# Patient Record
Sex: Female | Born: 1955
Health system: Southern US, Community
[De-identification: ages and names within clinical notes are randomized; demographics above are authoritative.]

## PROBLEM LIST (undated history)

## (undated) DIAGNOSIS — E785 Hyperlipidemia, unspecified: Secondary | ICD-10-CM

## (undated) DIAGNOSIS — I10 Essential (primary) hypertension: Secondary | ICD-10-CM

## (undated) DIAGNOSIS — M810 Age-related osteoporosis without current pathological fracture: Secondary | ICD-10-CM

## (undated) DIAGNOSIS — K219 Gastro-esophageal reflux disease without esophagitis: Secondary | ICD-10-CM

## (undated) DIAGNOSIS — I499 Cardiac arrhythmia, unspecified: Secondary | ICD-10-CM

## (undated) HISTORY — PX: ABDOMINAL HYSTERECTOMY: SHX81

## (undated) HISTORY — PX: APPENDECTOMY: SHX54

## (undated) HISTORY — DX: Essential (primary) hypertension: I10

## (undated) HISTORY — DX: Hyperlipidemia, unspecified: E78.5

## (undated) HISTORY — DX: Cardiac arrhythmia, unspecified: I49.9

## (undated) NOTE — Progress Notes (Signed)
Formatting of this note is different from the original.  .Patient ID:  Sherry Henderson is a 70 y.o. (DOB Jan 19, 1956) female.     CHIEF COMPLAINT       Patient presents with   ? Dysuria     x 5-6 days, pt states her infection are recurring       BP 102/82 (BP Location: Right arm, Patient Position: Sitting)  Pulse 72  Resp 18  Wt 163 lb 4.8 oz (74.1 kg)  BMI 24.12 kg/m2    PAST MEDICAL HISTORY     Past Medical History:   Diagnosis Date   ? Depression    ? Hyperlipidemia    ? Hypertension      PAST SURGICAL HISTORY   History reviewed. No pertinent surgical history.    FAMILY HISTORY     Family History   Problem Relation Age of Onset   ? Hypertension Mother    ? Diabetes Father      SOCIAL HISTORY    reports that she has never smoked. She has never used smokeless tobacco.    MEDICATIONS AS OF CURRENT ENCOUNTER     Outpatient Encounter Prescriptions as of 07/22/2016   Medication Sig Dispense Refill   ? alendronate (FOSAMAX) 70 mg tablet take 1 tablet BY MOUTH EVERY WEEK ON AN EMPTY STOMACH WITH 6 TO 8 OUNCES OF WATER; AVOID FOOD/MEDICATIONS FOR 30 MINUTES; REMAIN UPRIGHT 4 tablet 5   ? atorvastatin (LIPITOR) 40 mg tablet Take one tablet (40 mg total) by mouth daily. 30 tablet 11   ? dorzolamide (TRUSOPT) 2% opthalmic solution INSTILL 1 DROP INTO BOTH EYES TWICE DAILY  1   ? dorzolamide-timolol (COSOPT) 22.3-6.8 MG/ML ophthalmic solution instill 1 drop into both eyes twice a day  0   ? latanoprost (XALATAN) 0.005% ophthalmic solution Place 1 drop into both eyes daily.   1   ? losartan potassium (COZAAR) 50 mg tablet take 1 tablet by mouth once daily 30 tablet 2   ? Misc. Devices MISC Patient needs to have a pelvic trans vaginal ultrasound.  Dx right lower quadrant pain 1 each 0   ? [DISCONTINUED] diclofenac sodium (VOLTAREN) 75 mg EC tablet Take one tablet (75 mg total) by mouth 2 (two) times daily. 20 tablet 0   ? [DISCONTINUED] timolol maleate (TIMOPTIC) 0.5% ophthalmic solution INSTILL 1 DROP INTO BOTH EYES  TWICE DAILY  1     No facility-administered encounter medications on file as of 07/22/2016.      ALLERGIES   Review of patient's allergies indicates no known allergies.    BP 102/82 (BP Location: Right arm, Patient Position: Sitting)  Pulse 72  Resp 18  Wt 163 lb 4.8 oz (74.1 kg)  BMI 24.12 kg/m2  HISTORY OF PRESENT ILLNESS     Pt of D.r Bolling  Hx of HTN doing fine with meds  No allergies  Burning on urination for about 5 days  Tried hydration but symptoms persistent  No n/v no back pain no fever no chills  No hematuria    PHYSICAL EXAM   BP 102/82 (BP Location: Right arm, Patient Position: Sitting)  Pulse 72  Resp 18  Wt 163 lb 4.8 oz (74.1 kg)  BMI 24.12 kg/m2   Vital signs reviewed  General:  Well-developed, well-nourished female in no acute distress  NECK: Normal appearance  Neck: Soft, supple, no adenopathy, normal appearance  Heart:  Regular rate and rhythm without murmurs, rubs or gallops.  Lungs: Clear to ausculation  bilaterally.  Abdomen: Soft, non-tender, nondistended, without hepatosplenomegaly or masses.  Normal bowel sounds.    Musculoskeletal exam: No cyanosis of fingers, No swelling of legs  Nails:  No clubbing of fingernails    No cva tenderness    ASSESSMENT AND PLAN     Urinary tract infection  Urine appears infected  Will send culture  Will try bactrim DS 1 twice a day for 3 days    Patient instructed to return to the office or the emergency department if symptoms worsen or persist.    HTN  Continue meds  At goal under 140/90    Electronically signed by Roberts Gaudy, MD at 07/22/2016  4:31 PM EDT

---

## 2013-04-16 ENCOUNTER — Emergency Department (HOSPITAL_COMMUNITY)
Admission: EM | Admit: 2013-04-16 | Discharge: 2013-04-16 | Disposition: A | Discharge status: Home / Self Care | Payer: Self-pay | Attending: Emergency Medicine | Admitting: Emergency Medicine

## 2013-04-16 ENCOUNTER — Emergency Department (HOSPITAL_COMMUNITY): Payer: Self-pay

## 2013-04-16 ENCOUNTER — Encounter (HOSPITAL_COMMUNITY): Payer: Self-pay | Admitting: Emergency Medicine

## 2013-04-16 DIAGNOSIS — K219 Gastro-esophageal reflux disease without esophagitis: Secondary | ICD-10-CM | POA: Insufficient documentation

## 2013-04-16 DIAGNOSIS — Z79899 Other long term (current) drug therapy: Secondary | ICD-10-CM | POA: Insufficient documentation

## 2013-04-16 DIAGNOSIS — Y9229 Other specified public building as the place of occurrence of the external cause: Secondary | ICD-10-CM | POA: Insufficient documentation

## 2013-04-16 DIAGNOSIS — I951 Orthostatic hypotension: Secondary | ICD-10-CM | POA: Insufficient documentation

## 2013-04-16 DIAGNOSIS — I10 Essential (primary) hypertension: Secondary | ICD-10-CM | POA: Insufficient documentation

## 2013-04-16 DIAGNOSIS — S0003XA Contusion of scalp, initial encounter: Secondary | ICD-10-CM | POA: Insufficient documentation

## 2013-04-16 DIAGNOSIS — W1809XA Striking against other object with subsequent fall, initial encounter: Secondary | ICD-10-CM | POA: Insufficient documentation

## 2013-04-16 DIAGNOSIS — E785 Hyperlipidemia, unspecified: Secondary | ICD-10-CM | POA: Insufficient documentation

## 2013-04-16 DIAGNOSIS — R42 Dizziness and giddiness: Secondary | ICD-10-CM | POA: Insufficient documentation

## 2013-04-16 DIAGNOSIS — S0993XA Unspecified injury of face, initial encounter: Secondary | ICD-10-CM | POA: Insufficient documentation

## 2013-04-16 DIAGNOSIS — Y9301 Activity, walking, marching and hiking: Secondary | ICD-10-CM | POA: Insufficient documentation

## 2013-04-16 DIAGNOSIS — R55 Syncope and collapse: Secondary | ICD-10-CM | POA: Insufficient documentation

## 2013-04-16 DIAGNOSIS — M810 Age-related osteoporosis without current pathological fracture: Secondary | ICD-10-CM | POA: Insufficient documentation

## 2013-04-16 HISTORY — DX: Age-related osteoporosis without current pathological fracture: M81.0

## 2013-04-16 HISTORY — DX: Gastro-esophageal reflux disease without esophagitis: K21.9

## 2013-04-16 HISTORY — DX: Essential (primary) hypertension: I10

## 2013-04-16 HISTORY — DX: Hyperlipidemia, unspecified: E78.5

## 2013-04-16 LAB — BASIC METABOLIC PANEL
CO2: 25 mEq/L (ref 19–32)
Chloride: 104 mEq/L (ref 96–112)
Creatinine, Ser: 0.93 mg/dL (ref 0.50–1.10)
GFR calc Af Amer: 76 mL/min — ABNORMAL LOW (ref >90)
Potassium: 3.6 mEq/L (ref 3.5–5.1)
Sodium: 141 mEq/L (ref 135–145)

## 2013-04-16 LAB — CBC
MCV: 95.6 fL (ref 78.0–100.0)
Platelets: 296 10*3/uL (ref 150–400)
RBC: 4.12 MIL/uL (ref 3.87–5.11)
RDW: 12.7 % (ref 11.5–15.5)
WBC: 7 10*3/uL (ref 4.0–10.5)

## 2013-04-16 MED ORDER — SODIUM CHLORIDE 0.9 % IV BOLUS (SEPSIS)
1000.0000 mL | Freq: Once | INTRAVENOUS | Status: AC
  Administered 2013-04-16: 1000 mL via INTRAVENOUS

## 2013-04-16 NOTE — ED Notes (Signed)
ZOX:[id number]<AV> Expected date:04/16/13<BR> Expected time: 7:37 PM<BR> Means of arrival:Ambulance<BR> Comments:<BR> Syncopal episode

## 2013-04-16 NOTE — ED Provider Notes (Addendum)
History    CSN: [id number] Arrival date & time 04/16/13  1956  First MD Initiated Contact with Patient 04/16/13 2020     Chief Complaint  Patient presents with  . Loss of Consciousness   (Consider location/radiation/quality/duration/timing/severity/associated sxs/prior Treatment) HPI Comments: Pt comes in with cc of syncope. Pt has no cardiac hx. States that she donated blood, and got up - only to get dizzy, and then pass out. Pt had no chest pain, palpitations, dib. No hx of CHF, premature CAD in the family. Pt has a bruise to the head, and some neck stiffness from the fall.  No hx of PE, DVT, pt is on hormone therapy.  The history is provided by the patient.   Past Medical History  Diagnosis Date  . Hypertension   . Hyperlipidemia   . Osteoporosis   . GERD (gastroesophageal reflux disease)    Past Surgical History  Procedure Laterality Date  . Abdominal hysterectomy      at 43  . Appendectomy      at 57yo   History reviewed. No pertinent family history. History  Substance Use Topics  . Smoking status: Never Smoker   . Smokeless tobacco: Never Used  . Alcohol Use: 0.6 oz/week    1 Cans of beer per week   OB History   Grav Para Term Preterm Abortions TAB SAB Ect Mult Living                 Review of Systems  Constitutional: Negative for activity change.  HENT: Positive for neck stiffness. Negative for neck pain.   Respiratory: Negative for shortness of breath.   Cardiovascular: Negative for chest pain and palpitations.  Gastrointestinal: Negative for nausea, vomiting and abdominal pain.  Genitourinary: Negative for dysuria.  Neurological: Positive for syncope.  Hematological: Does not bruise/bleed easily.    Allergies  Review of patient's allergies indicates no known allergies.  Home Medications   Current Outpatient Rx  Name  Route  Sig  Dispense  Refill  . alendronate (FOSAMAX) 70 MG tablet   Oral   Take 70 mg by mouth every 7 (seven) days. Take  with a full glass of water on an empty stomach.  Taken on Sundays.         Marland Kitchen atenolol (TENORMIN) 50 MG tablet   Oral   Take 50 mg by mouth daily.         . cetirizine (ZYRTEC) 10 MG tablet   Oral   Take 10 mg by mouth daily as needed for allergies.         Marland Kitchen estradiol (ESTRACE) 0.5 MG tablet   Oral   Take 0.5 mg by mouth daily.         Marland Kitchen latanoprost (XALATAN) 0.005 % ophthalmic solution   Both Eyes   Place 1 drop into both eyes daily.         . Multiple Vitamin (MULTIVITAMIN WITH MINERALS) TABS   Oral   Take 1 tablet by mouth daily.         Marland Kitchen omeprazole (PRILOSEC) 40 MG capsule   Oral   Take 40 mg by mouth daily as needed (for heartburn).          . simvastatin (ZOCOR) 20 MG tablet   Oral   Take 20 mg by mouth daily.           BP 110/53  Pulse 57  Temp(Src) 98.1 F (36.7 C) (Oral)  Resp 16  Ht 5\' 10"  (  1.778 m)  Wt 180 lb (81.647 kg)  BMI 25.83 kg/m2  SpO2 99% Physical Exam  Nursing note and vitals reviewed. Constitutional: She is oriented to person, place, and time. She appears well-developed and well-nourished.  HENT:  Head: Normocephalic and atraumatic.  Eyes: EOM are normal. Pupils are equal, round, and reactive to light.  Neck: Neck supple. No JVD present.  No midline c-spine tenderness, pt able to turn head to 45 degrees bilaterally without any pain and able to flex neck to the chest and extend without any pain or neurologic symptoms.   Cardiovascular: Normal rate, regular rhythm and normal heart sounds.   No murmur heard. Pulmonary/Chest: Effort normal. No respiratory distress.  Abdominal: Soft. She exhibits no distension. There is no tenderness. There is no rebound and no guarding.  Musculoskeletal:  Hematoma to the right scalp  Head to toe evaluation shows no bleeding of the scalp, no facial abrasions, step offs, crepitus, no tenderness to palpation of the bilateral upper and lower extremities, no gross deformities, no chest tenderness,  no pelvic pain.   Neurological: She is alert and oriented to person, place, and time.  Skin: Skin is warm and dry.    ED Course  Procedures (including critical care time) Labs Reviewed  CBC  BASIC METABOLIC PANEL   No results found. No diagnosis found.  MDM  DDx includes: Orthostatic hypotension Stroke Vertebral artery dissection/stenosis Dysrhythmia PE Vasovagal/neurocardiogenic syncope Aortic stenosis Valvular disorder/Cardiomyopathy Anemia   Pt comes in with cc of syncope.   Date: 04/16/2013  Rate: 50  Rhythm: normal sinus rhythm and premature ventricular contractions (PVC)  QRS Axis: normal  Intervals: normal  ST/T Wave abnormalities: normal  Conduction Disutrbances:none  Narrative Interpretation:   Old EKG Reviewed: none available  Pt just donated blood and had a syncopal episode. She has no cardiac hx, no CHF hx. She is noted to be bradycardic, but her HR was 55 at my evaluation, and she is completely asymptomatic, she is not on any beta blockers - i suspect that the bradycardia is her baseline normal HR. No AV or interval abnormalities appreciated. PE also not likely given the presentation being right after the blood was donated. Cspine cleared clinically. CT head ordered as per the bruise and a headache.     Derwood Kaplan, MD 04/16/13 2113  Derwood Kaplan, MD 04/16/13 2148

## 2013-04-16 NOTE — ED Notes (Signed)
Pt stated she donated blood today, has not donated since a reaction x1 year ago after donating plasma.  Pt used to donate blood regularly before that.  Pt reports inadequate fluid intake today.  Pt walked [place] the canteen then fell hitting the R side of her head and chipping her R upper tooth.  Pt reports neck pain as well.  Pt placed in C-collar.  Pt a&o x4, states she does not remember falling.

## 2013-04-16 NOTE — ED Notes (Signed)
Unable to draw blood off of IV stick, phlebotomy at bedside to draw at this time

## 2016-03-02 DIAGNOSIS — K5730 Diverticulosis of large intestine without perforation or abscess without bleeding: Secondary | ICD-10-CM | POA: Diagnosis not present

## 2016-03-02 DIAGNOSIS — Z1211 Encounter for screening for malignant neoplasm of colon: Secondary | ICD-10-CM | POA: Diagnosis not present

## 2016-03-02 DIAGNOSIS — K648 Other hemorrhoids: Secondary | ICD-10-CM | POA: Diagnosis not present

## 2016-06-01 DIAGNOSIS — H401133 Primary open-angle glaucoma, bilateral, severe stage: Secondary | ICD-10-CM | POA: Diagnosis not present

## 2016-06-01 DIAGNOSIS — H2513 Age-related nuclear cataract, bilateral: Secondary | ICD-10-CM | POA: Diagnosis not present

## 2016-07-22 DIAGNOSIS — Z23 Encounter for immunization: Secondary | ICD-10-CM | POA: Diagnosis not present

## 2016-07-22 DIAGNOSIS — I10 Essential (primary) hypertension: Secondary | ICD-10-CM | POA: Diagnosis not present

## 2016-07-22 DIAGNOSIS — R399 Unspecified symptoms and signs involving the genitourinary system: Secondary | ICD-10-CM | POA: Diagnosis not present

## 2016-12-03 DIAGNOSIS — H401133 Primary open-angle glaucoma, bilateral, severe stage: Secondary | ICD-10-CM | POA: Diagnosis not present

## 2016-12-03 DIAGNOSIS — H00025 Hordeolum internum left lower eyelid: Secondary | ICD-10-CM | POA: Diagnosis not present

## 2016-12-16 DIAGNOSIS — I10 Essential (primary) hypertension: Secondary | ICD-10-CM | POA: Diagnosis not present

## 2016-12-16 DIAGNOSIS — Z23 Encounter for immunization: Secondary | ICD-10-CM | POA: Diagnosis not present

## 2016-12-16 DIAGNOSIS — E7800 Pure hypercholesterolemia, unspecified: Secondary | ICD-10-CM | POA: Diagnosis not present

## 2016-12-16 DIAGNOSIS — E7439 Other disorders of intestinal carbohydrate absorption: Secondary | ICD-10-CM | POA: Diagnosis not present

## 2016-12-16 DIAGNOSIS — M810 Age-related osteoporosis without current pathological fracture: Secondary | ICD-10-CM | POA: Diagnosis not present

## 2016-12-24 ENCOUNTER — Other Ambulatory Visit: Payer: Self-pay | Admitting: Physician Assistant

## 2016-12-24 DIAGNOSIS — Z1231 Encounter for screening mammogram for malignant neoplasm of breast: Secondary | ICD-10-CM

## 2017-01-14 DIAGNOSIS — R109 Unspecified abdominal pain: Secondary | ICD-10-CM | POA: Diagnosis not present

## 2017-01-17 ENCOUNTER — Other Ambulatory Visit: Payer: Self-pay | Admitting: Physician Assistant

## 2017-01-17 DIAGNOSIS — R109 Unspecified abdominal pain: Secondary | ICD-10-CM

## 2017-01-18 ENCOUNTER — Other Ambulatory Visit: Payer: Self-pay

## 2017-01-21 ENCOUNTER — Ambulatory Visit
Admission: RE | Admit: 2017-01-21 | Discharge: 2017-01-21 | Disposition: A | Discharge status: Home / Self Care | Payer: BLUE CROSS/BLUE SHIELD | Source: Ambulatory Visit | Attending: Physician Assistant | Admitting: Physician Assistant

## 2017-01-21 DIAGNOSIS — R109 Unspecified abdominal pain: Secondary | ICD-10-CM | POA: Diagnosis not present

## 2017-01-21 DIAGNOSIS — Z1231 Encounter for screening mammogram for malignant neoplasm of breast: Secondary | ICD-10-CM | POA: Diagnosis not present

## 2017-01-21 MED ORDER — IOPAMIDOL (ISOVUE-300) INJECTION 61%
100.0000 mL | Freq: Once | INTRAVENOUS | Status: AC | PRN
  Administered 2017-01-21: 100 mL via INTRAVENOUS

## 2017-06-15 DIAGNOSIS — H401133 Primary open-angle glaucoma, bilateral, severe stage: Secondary | ICD-10-CM | POA: Diagnosis not present

## 2017-06-15 DIAGNOSIS — H2513 Age-related nuclear cataract, bilateral: Secondary | ICD-10-CM | POA: Diagnosis not present

## 2017-06-22 ENCOUNTER — Other Ambulatory Visit (HOSPITAL_COMMUNITY)
Admission: RE | Admit: 2017-06-22 | Discharge: 2017-06-22 | Disposition: A | Discharge status: Home / Self Care | Payer: BLUE CROSS/BLUE SHIELD | Source: Ambulatory Visit | Attending: Family Medicine | Admitting: Family Medicine

## 2017-06-22 DIAGNOSIS — Z01419 Encounter for gynecological examination (general) (routine) without abnormal findings: Secondary | ICD-10-CM | POA: Diagnosis not present

## 2017-06-23 ENCOUNTER — Other Ambulatory Visit: Payer: Self-pay | Admitting: Physician Assistant

## 2017-06-23 DIAGNOSIS — M810 Age-related osteoporosis without current pathological fracture: Secondary | ICD-10-CM | POA: Diagnosis not present

## 2017-06-23 DIAGNOSIS — E7800 Pure hypercholesterolemia, unspecified: Secondary | ICD-10-CM | POA: Diagnosis not present

## 2017-06-23 DIAGNOSIS — Z0000 Encounter for general adult medical examination without abnormal findings: Secondary | ICD-10-CM | POA: Diagnosis not present

## 2017-06-23 DIAGNOSIS — Z124 Encounter for screening for malignant neoplasm of cervix: Secondary | ICD-10-CM | POA: Diagnosis not present

## 2017-06-23 DIAGNOSIS — I10 Essential (primary) hypertension: Secondary | ICD-10-CM | POA: Diagnosis not present

## 2017-06-24 LAB — CYTOLOGY - PAP: DIAGNOSIS: NEGATIVE

## 2017-08-18 DIAGNOSIS — M810 Age-related osteoporosis without current pathological fracture: Secondary | ICD-10-CM | POA: Diagnosis not present

## 2017-11-22 DIAGNOSIS — H2513 Age-related nuclear cataract, bilateral: Secondary | ICD-10-CM | POA: Diagnosis not present

## 2017-11-22 DIAGNOSIS — H401133 Primary open-angle glaucoma, bilateral, severe stage: Secondary | ICD-10-CM | POA: Diagnosis not present

## 2017-12-01 ENCOUNTER — Other Ambulatory Visit: Payer: Self-pay | Admitting: Physician Assistant

## 2017-12-01 ENCOUNTER — Ambulatory Visit
Admission: RE | Admit: 2017-12-01 | Discharge: 2017-12-01 | Disposition: A | Discharge status: Home / Self Care | Payer: BLUE CROSS/BLUE SHIELD | Source: Ambulatory Visit | Attending: Physician Assistant | Admitting: Physician Assistant

## 2017-12-01 DIAGNOSIS — R079 Chest pain, unspecified: Secondary | ICD-10-CM

## 2017-12-05 DIAGNOSIS — H2513 Age-related nuclear cataract, bilateral: Secondary | ICD-10-CM | POA: Diagnosis not present

## 2017-12-05 DIAGNOSIS — H401133 Primary open-angle glaucoma, bilateral, severe stage: Secondary | ICD-10-CM | POA: Diagnosis not present

## 2017-12-29 DIAGNOSIS — E7800 Pure hypercholesterolemia, unspecified: Secondary | ICD-10-CM | POA: Diagnosis not present

## 2017-12-29 DIAGNOSIS — M810 Age-related osteoporosis without current pathological fracture: Secondary | ICD-10-CM | POA: Diagnosis not present

## 2017-12-29 DIAGNOSIS — I10 Essential (primary) hypertension: Secondary | ICD-10-CM | POA: Diagnosis not present

## 2017-12-29 DIAGNOSIS — E7439 Other disorders of intestinal carbohydrate absorption: Secondary | ICD-10-CM | POA: Diagnosis not present

## 2018-04-28 DIAGNOSIS — H401133 Primary open-angle glaucoma, bilateral, severe stage: Secondary | ICD-10-CM | POA: Diagnosis not present

## 2018-04-28 DIAGNOSIS — H2513 Age-related nuclear cataract, bilateral: Secondary | ICD-10-CM | POA: Diagnosis not present

## 2018-08-20 IMAGING — MG 2D DIGITAL SCREENING BILATERAL MAMMOGRAM WITH CAD AND ADJUNCT TO
8 of 12 series · 8 of 28 positions shown · non-contrast
Comparison: Previous exam(s).

CLINICAL DATA: Screening.

EXAM:
2D DIGITAL SCREENING BILATERAL MAMMOGRAM WITH CAD AND ADJUNCT TOMO

[L CC]
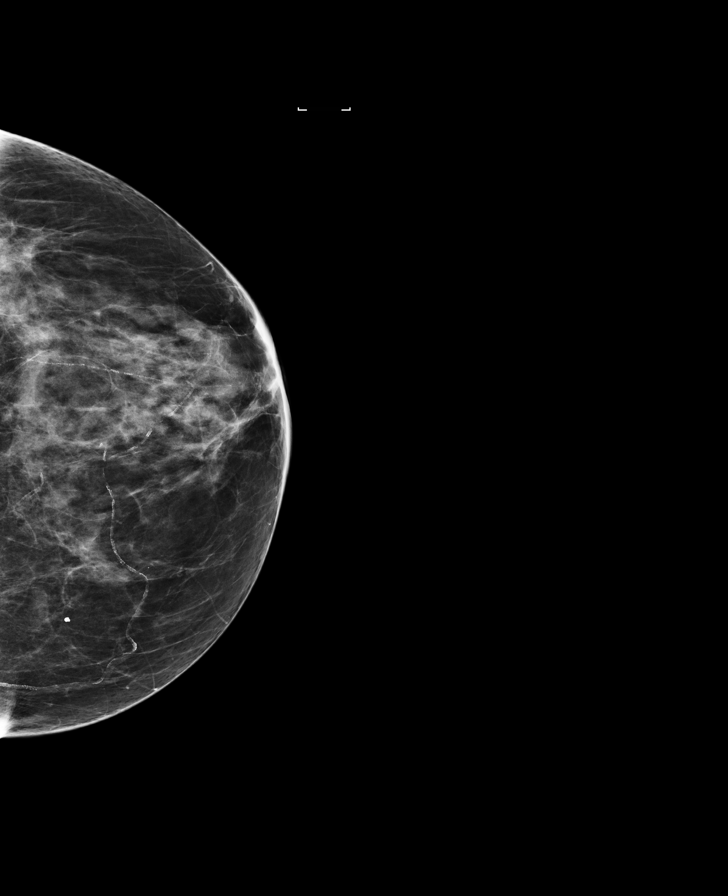

[R CC synth-2D]
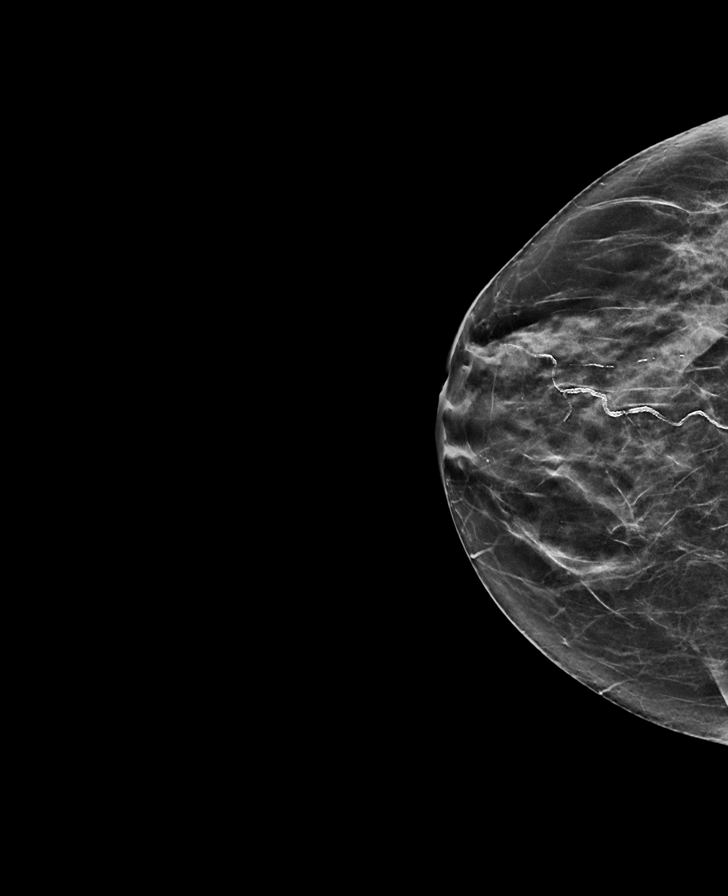

[L MLO]
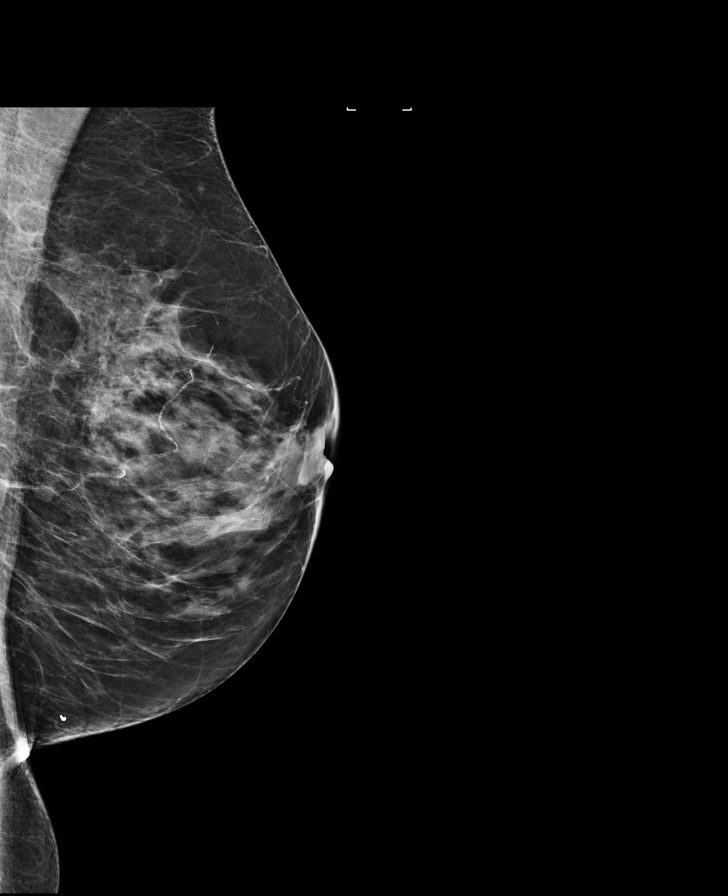

[R MLO synth-2D]
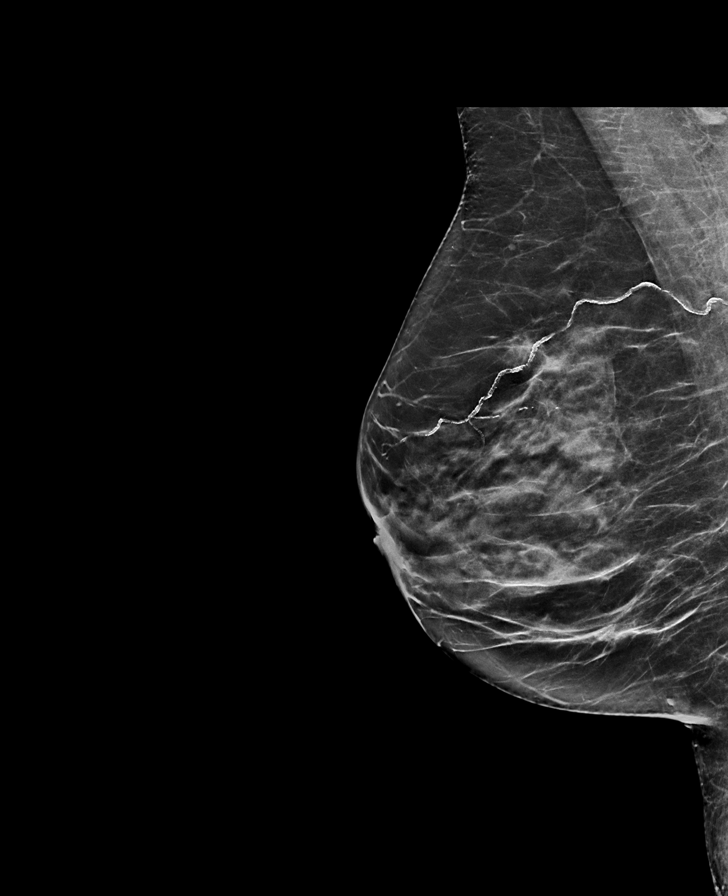

[L CC synth-2D]
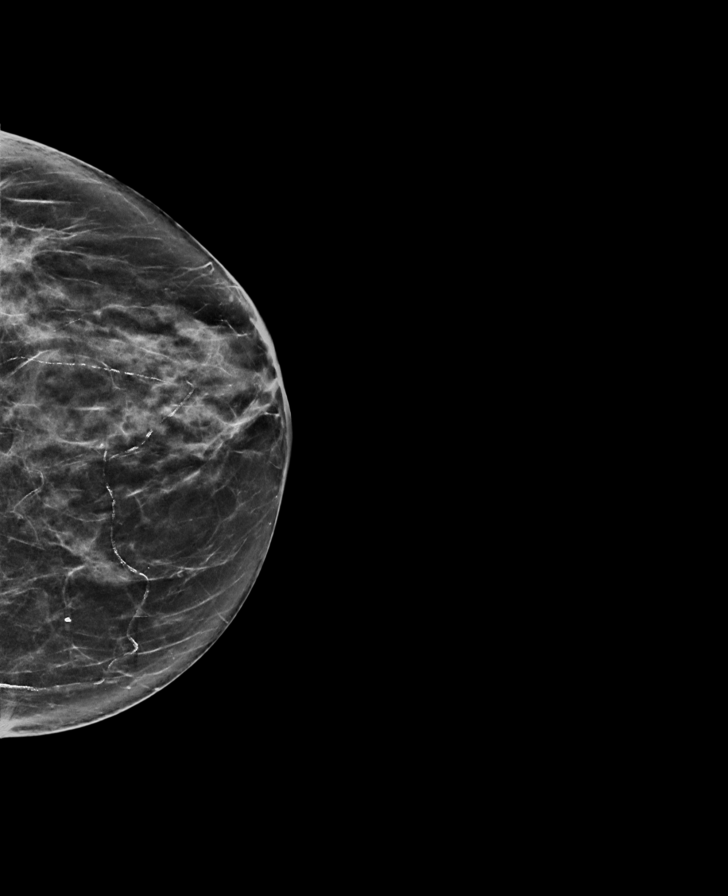

[R CC]
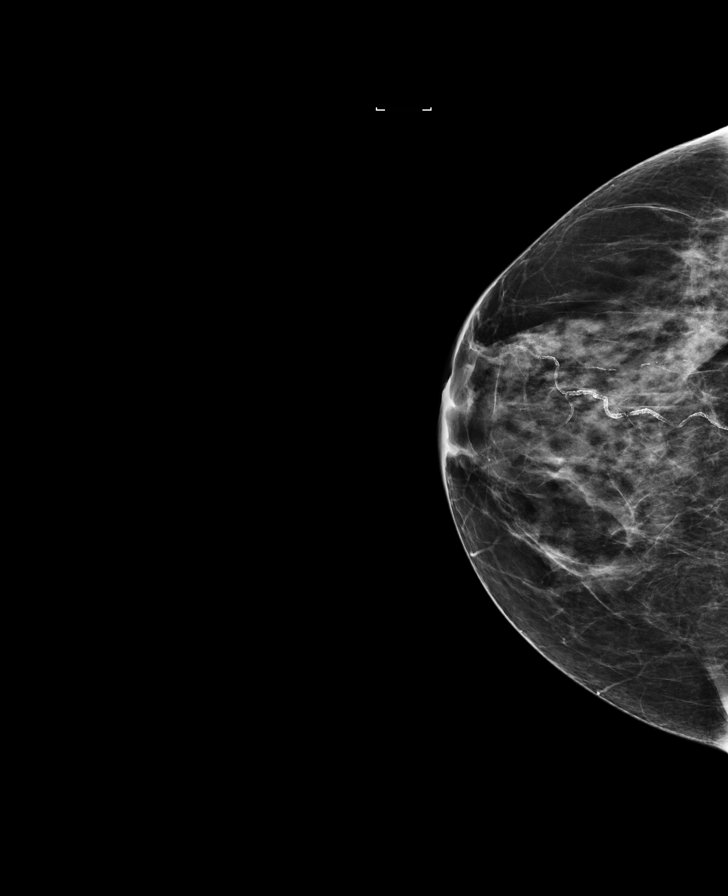

[L MLO synth-2D]
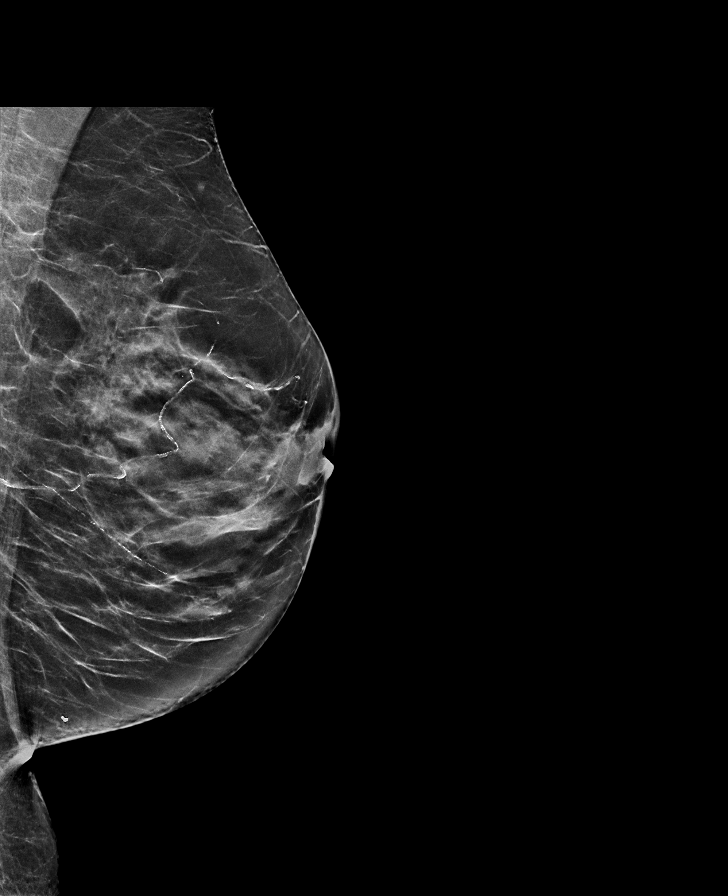

[R MLO]
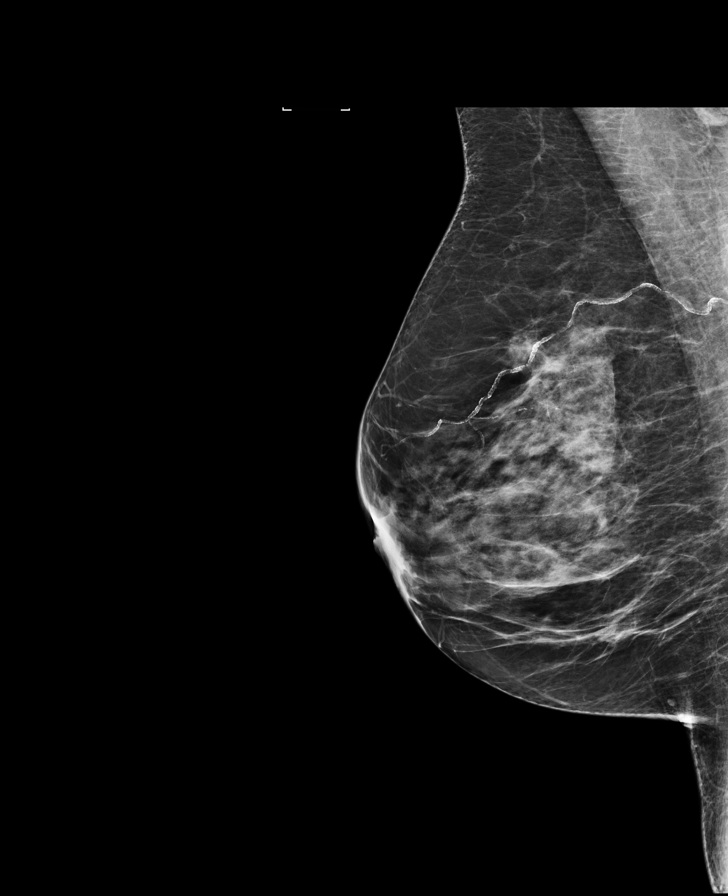

[8 of 28 positions shown; findings below may reference images not displayed]

ACR Breast Density Category c: The breast tissue is heterogeneously
dense, which may obscure small masses.
FINDINGS: There are no findings suspicious for malignancy. Images were
processed with CAD.
IMPRESSION: No mammographic evidence of malignancy. A result letter of this
screening mammogram will be mailed directly to the patient.

RECOMMENDATION:
Screening mammogram in one year. (Code:[id number])

BI-RADS CATEGORY  1: Negative.

## 2018-09-05 DIAGNOSIS — H401133 Primary open-angle glaucoma, bilateral, severe stage: Secondary | ICD-10-CM | POA: Diagnosis not present

## 2018-09-05 DIAGNOSIS — H2513 Age-related nuclear cataract, bilateral: Secondary | ICD-10-CM | POA: Diagnosis not present

## 2018-09-06 DIAGNOSIS — Z0000 Encounter for general adult medical examination without abnormal findings: Secondary | ICD-10-CM | POA: Diagnosis not present

## 2018-09-06 DIAGNOSIS — E7439 Other disorders of intestinal carbohydrate absorption: Secondary | ICD-10-CM | POA: Diagnosis not present

## 2018-09-06 DIAGNOSIS — I10 Essential (primary) hypertension: Secondary | ICD-10-CM | POA: Diagnosis not present

## 2018-09-06 DIAGNOSIS — Z23 Encounter for immunization: Secondary | ICD-10-CM | POA: Diagnosis not present

## 2018-09-06 DIAGNOSIS — E7800 Pure hypercholesterolemia, unspecified: Secondary | ICD-10-CM | POA: Diagnosis not present

## 2018-09-11 ENCOUNTER — Other Ambulatory Visit: Payer: Self-pay | Admitting: Physician Assistant

## 2018-09-11 DIAGNOSIS — Z1231 Encounter for screening mammogram for malignant neoplasm of breast: Secondary | ICD-10-CM

## 2018-10-26 ENCOUNTER — Ambulatory Visit: Payer: BLUE CROSS/BLUE SHIELD

## 2018-12-19 ENCOUNTER — Ambulatory Visit
Admission: RE | Admit: 2018-12-19 | Discharge: 2018-12-19 | Disposition: A | Discharge status: Home / Self Care | Payer: No Typology Code available for payment source | Source: Ambulatory Visit | Attending: Physician Assistant | Admitting: Physician Assistant

## 2018-12-19 DIAGNOSIS — Z1231 Encounter for screening mammogram for malignant neoplasm of breast: Secondary | ICD-10-CM

## 2018-12-20 ENCOUNTER — Other Ambulatory Visit: Payer: Self-pay | Admitting: Physician Assistant

## 2018-12-20 DIAGNOSIS — R928 Other abnormal and inconclusive findings on diagnostic imaging of breast: Secondary | ICD-10-CM

## 2018-12-22 ENCOUNTER — Ambulatory Visit
Admission: RE | Admit: 2018-12-22 | Discharge: 2018-12-22 | Disposition: A | Discharge status: Home / Self Care | Payer: No Typology Code available for payment source | Source: Ambulatory Visit | Attending: Physician Assistant | Admitting: Physician Assistant

## 2018-12-22 DIAGNOSIS — R928 Other abnormal and inconclusive findings on diagnostic imaging of breast: Secondary | ICD-10-CM

## 2019-03-07 DIAGNOSIS — R809 Proteinuria, unspecified: Secondary | ICD-10-CM | POA: Diagnosis not present

## 2019-03-07 DIAGNOSIS — M810 Age-related osteoporosis without current pathological fracture: Secondary | ICD-10-CM | POA: Diagnosis not present

## 2019-03-07 DIAGNOSIS — H409 Unspecified glaucoma: Secondary | ICD-10-CM | POA: Diagnosis not present

## 2019-03-07 DIAGNOSIS — I129 Hypertensive chronic kidney disease with stage 1 through stage 4 chronic kidney disease, or unspecified chronic kidney disease: Secondary | ICD-10-CM | POA: Diagnosis not present

## 2019-03-07 DIAGNOSIS — E7800 Pure hypercholesterolemia, unspecified: Secondary | ICD-10-CM | POA: Diagnosis not present

## 2019-03-07 DIAGNOSIS — N182 Chronic kidney disease, stage 2 (mild): Secondary | ICD-10-CM | POA: Diagnosis not present

## 2019-03-07 DIAGNOSIS — E7439 Other disorders of intestinal carbohydrate absorption: Secondary | ICD-10-CM | POA: Diagnosis not present

## 2019-03-07 DIAGNOSIS — E559 Vitamin D deficiency, unspecified: Secondary | ICD-10-CM | POA: Diagnosis not present

## 2019-03-15 DIAGNOSIS — E559 Vitamin D deficiency, unspecified: Secondary | ICD-10-CM | POA: Diagnosis not present

## 2019-03-15 DIAGNOSIS — R809 Proteinuria, unspecified: Secondary | ICD-10-CM | POA: Diagnosis not present

## 2019-03-15 DIAGNOSIS — E7800 Pure hypercholesterolemia, unspecified: Secondary | ICD-10-CM | POA: Diagnosis not present

## 2019-03-15 DIAGNOSIS — R7303 Prediabetes: Secondary | ICD-10-CM | POA: Diagnosis not present

## 2019-12-08 ENCOUNTER — Ambulatory Visit: Payer: Medicare Other | Attending: Internal Medicine

## 2019-12-08 DIAGNOSIS — Z23 Encounter for immunization: Secondary | ICD-10-CM | POA: Insufficient documentation

## 2019-12-08 NOTE — Progress Notes (Signed)
   Covid-19 Vaccination Clinic  Name:  Charlotte Gibson    MRN: [id number] DOB: 1956/05/08  12/08/2019  Charlotte Gibson was observed post Covid-19 immunization for 15 minutes without incidence. She was provided with Vaccine Information Sheet and instruction to access the V-Safe system.   Charlotte Gibson was instructed to call 911 with any severe reactions post vaccine: Marland Kitchen Difficulty breathing  . Swelling of your face and throat  . A fast heartbeat  . A bad rash all over your body  . Dizziness and weakness    Immunizations Administered    Name Date Dose VIS Date Route   Pfizer COVID-19 Vaccine 12/08/2019  8:40 AM 0.3 mL 10/05/2019 Intramuscular   Manufacturer: [employer], [employer]   Lot: [id number]   NDC: [address]-3

## 2019-12-12 DIAGNOSIS — H401133 Primary open-angle glaucoma, bilateral, severe stage: Secondary | ICD-10-CM | POA: Diagnosis not present

## 2019-12-12 DIAGNOSIS — H2513 Age-related nuclear cataract, bilateral: Secondary | ICD-10-CM | POA: Diagnosis not present

## 2019-12-12 DIAGNOSIS — H5213 Myopia, bilateral: Secondary | ICD-10-CM | POA: Diagnosis not present

## 2019-12-12 DIAGNOSIS — H47391 Other disorders of optic disc, right eye: Secondary | ICD-10-CM | POA: Diagnosis not present

## 2019-12-30 ENCOUNTER — Ambulatory Visit: Payer: Medicare Other | Attending: Internal Medicine

## 2019-12-30 DIAGNOSIS — Z23 Encounter for immunization: Secondary | ICD-10-CM | POA: Insufficient documentation

## 2019-12-30 NOTE — Progress Notes (Signed)
   Covid-19 Vaccination Clinic  Name:  Charlotte Gibson    MRN: [id number] DOB: [birth year]  12/30/2019  [name] was observed post Covid-19 immunization for 15 minutes without incident. She was provided with Vaccine Information Sheet and instruction to access the V-Safe system.   [name] was instructed to call 911 with any severe reactions post vaccine: Marland Kitchen Difficulty breathing  . Swelling of face and throat  . A fast heartbeat  . A bad rash all over body  . Dizziness and weakness   Immunizations Administered    Name Date Dose VIS Date Route   Pfizer COVID-19 Vaccine 12/30/2019  2:04 PM 0.3 mL 10/05/2019 Intramuscular   Manufacturer: [employer], [employer]   Lot: [id number]   NDC: [address]-3

## 2020-01-14 DIAGNOSIS — E7439 Other disorders of intestinal carbohydrate absorption: Secondary | ICD-10-CM | POA: Diagnosis not present

## 2020-01-14 DIAGNOSIS — E7800 Pure hypercholesterolemia, unspecified: Secondary | ICD-10-CM | POA: Diagnosis not present

## 2020-01-14 DIAGNOSIS — E559 Vitamin D deficiency, unspecified: Secondary | ICD-10-CM | POA: Diagnosis not present

## 2020-01-14 DIAGNOSIS — Z0000 Encounter for general adult medical examination without abnormal findings: Secondary | ICD-10-CM | POA: Diagnosis not present

## 2020-01-14 DIAGNOSIS — R809 Proteinuria, unspecified: Secondary | ICD-10-CM | POA: Diagnosis not present

## 2020-01-14 DIAGNOSIS — I129 Hypertensive chronic kidney disease with stage 1 through stage 4 chronic kidney disease, or unspecified chronic kidney disease: Secondary | ICD-10-CM | POA: Diagnosis not present

## 2020-01-14 DIAGNOSIS — Z1159 Encounter for screening for other viral diseases: Secondary | ICD-10-CM | POA: Diagnosis not present

## 2020-01-14 DIAGNOSIS — Z23 Encounter for immunization: Secondary | ICD-10-CM | POA: Diagnosis not present

## 2020-01-14 LAB — BASIC METABOLIC PANEL
BUN: 17 (ref 4–21)
Creatinine: 0.8 (ref <=1.1)
Glucose: 147
Sodium: 142 (ref 137–147)

## 2020-01-14 LAB — COMPREHENSIVE METABOLIC PANEL: Calcium: 9.2 (ref 8.7–10.7)

## 2020-01-14 LAB — VITAMIN D 25 HYDROXY (VIT D DEFICIENCY, FRACTURES): Vit D, 25-Hydroxy: 28.8

## 2020-01-16 ENCOUNTER — Other Ambulatory Visit: Payer: Self-pay | Admitting: Physician Assistant

## 2020-01-16 DIAGNOSIS — M810 Age-related osteoporosis without current pathological fracture: Secondary | ICD-10-CM

## 2020-01-16 DIAGNOSIS — Z1231 Encounter for screening mammogram for malignant neoplasm of breast: Secondary | ICD-10-CM

## 2020-02-22 ENCOUNTER — Ambulatory Visit
Admission: RE | Admit: 2020-02-22 | Discharge: 2020-02-22 | Disposition: A | Discharge status: Home / Self Care | Payer: Medicare Other | Source: Ambulatory Visit | Attending: Physician Assistant | Admitting: Physician Assistant

## 2020-02-22 ENCOUNTER — Other Ambulatory Visit: Payer: Self-pay

## 2020-02-22 DIAGNOSIS — M810 Age-related osteoporosis without current pathological fracture: Secondary | ICD-10-CM | POA: Diagnosis not present

## 2020-02-22 DIAGNOSIS — Z780 Asymptomatic menopausal state: Secondary | ICD-10-CM | POA: Diagnosis not present

## 2020-02-22 DIAGNOSIS — Z1231 Encounter for screening mammogram for malignant neoplasm of breast: Secondary | ICD-10-CM | POA: Diagnosis not present

## 2020-04-21 DIAGNOSIS — H47391 Other disorders of optic disc, right eye: Secondary | ICD-10-CM | POA: Diagnosis not present

## 2020-04-21 DIAGNOSIS — H5213 Myopia, bilateral: Secondary | ICD-10-CM | POA: Diagnosis not present

## 2020-04-21 DIAGNOSIS — H401133 Primary open-angle glaucoma, bilateral, severe stage: Secondary | ICD-10-CM | POA: Diagnosis not present

## 2020-04-21 DIAGNOSIS — H2513 Age-related nuclear cataract, bilateral: Secondary | ICD-10-CM | POA: Diagnosis not present

## 2020-06-04 ENCOUNTER — Other Ambulatory Visit: Payer: Self-pay

## 2020-06-04 ENCOUNTER — Encounter: Payer: Self-pay | Admitting: Endocrinology

## 2020-06-25 ENCOUNTER — Other Ambulatory Visit: Payer: Self-pay

## 2020-06-25 ENCOUNTER — Encounter: Payer: Self-pay | Admitting: Endocrinology

## 2020-06-25 ENCOUNTER — Ambulatory Visit: Payer: Medicare Other | Admitting: Endocrinology

## 2020-06-25 DIAGNOSIS — M810 Age-related osteoporosis without current pathological fracture: Secondary | ICD-10-CM | POA: Diagnosis not present

## 2020-06-25 LAB — BASIC METABOLIC PANEL
BUN: 19 mg/dL (ref 6–23)
CO2: 29 mEq/L (ref 19–32)
Calcium: 10.4 mg/dL (ref 8.4–10.5)
Chloride: 106 mEq/L (ref 96–112)
Creatinine, Ser: 0.9 mg/dL (ref 0.40–1.20)
GFR: 62.56 mL/min (ref >60.00)
Glucose, Bld: 102 mg/dL — ABNORMAL HIGH (ref 70–99)
Potassium: 4.3 mEq/L (ref 3.5–5.1)
Sodium: 142 mEq/L (ref 135–145)

## 2020-06-25 LAB — T4, FREE: Free T4: 0.77 ng/dL (ref 0.60–1.60)

## 2020-06-25 LAB — VITAMIN D 25 HYDROXY (VIT D DEFICIENCY, FRACTURES): VITD: 34.48 ng/mL (ref 30.00–100.00)

## 2020-06-25 LAB — TSH: TSH: 1 u[IU]/mL (ref 0.35–4.50)

## 2020-06-25 NOTE — Progress Notes (Signed)
Subjective:    Patient ID: Charlotte Gibson, female    DOB: 06/06/1956, 64 y.o.   MRN: [id number]  HPI Pt is referred by Lennie Odor, for osteoporosis.  Pt was noted to have osteoporosis in 2018 (she had osteopenia for several years prior).  She took fosamax 2015-2020 (stopped due to heartburn). She then started Evista.  she has had these bony fractures (arm as a child, foot in 1975, and right mandible (approx 1985 and 1995)-- all traumatic).  She has no history of any of the following: multiple myeloma, renal dz, thyroid problems, steroids, alcoholism, smoking, liver dz, or primary hyperparathyroidism.  She does not take heparin or anticonvulsants.  She has TAH/USO in 1995.  She takes Vit-D, as part of a multivitamin.    Past Medical History:  Diagnosis Date   GERD (gastroesophageal reflux disease)    Hyperlipidemia    Hypertension    Osteoporosis     Past Surgical History:  Procedure Laterality Date   ABDOMINAL HYSTERECTOMY     at 51   APPENDECTOMY     at 64yo    Social History   Socioeconomic History   Marital status: Divorced    Spouse name: Not on file   Number of children: Not on file   Years of education: Not on file   Highest education level: Not on file  Occupational History   Not on file  Tobacco Use   Smoking status: Never Smoker   Smokeless tobacco: Never Used  Substance and Sexual Activity   Alcohol use: Yes    Alcohol/week: 1.0 standard drink    Types: 1 Cans of beer per week   Drug use: No   Sexual activity: Not on file  Other Topics Concern   Not on file  Social History Narrative   Not on file   Social Determinants of Health   Financial Resource Strain:    Difficulty of Paying Living Expenses: Not on file  Food Insecurity:    Worried About [place] in the Last Year: Not on file   Ran Out of Food in the Last Year: Not on file  Transportation Needs:    Lack of Transportation (Medical): Not on file   Lack of  Transportation (Non-Medical): Not on file  Physical Activity:    Days of Exercise per Week: Not on file   Minutes of Exercise per Session: Not on file  Stress:    Feeling of Stress : Not on file  Social Connections:    Frequency of Communication with Friends and Family: Not on file   Frequency of Social Gatherings with Friends and Family: Not on file   Attends Religious Services: Not on file   Active Member of Clubs or Organizations: Not on file   Attends Archivist Meetings: Not on file   Marital Status: Not on file  Intimate Partner Violence:    Fear of Current or Ex-Partner: Not on file   Emotionally Abused: Not on file   Physically Abused: Not on file   Sexually Abused: Not on file    Current Outpatient Medications on File Prior to Visit  Medication Sig Dispense Refill   atorvastatin (LIPITOR) 40 MG tablet Take 40 mg by mouth daily.     brimonidine (ALPHAGAN) 0.2 % ophthalmic solution 1 drop 2 (two) times daily.     Calcium Carbonate (CALCIUM 500 PO) Take 1 tablet by mouth daily.     dorzolamide-timolol (COSOPT) 22.3-6.8 MG/ML ophthalmic solution 1 drop 2 (  two) times daily.     latanoprost (XALATAN) 0.005 % ophthalmic solution Place 1 drop into both eyes 2 (two) times daily.      losartan (COZAAR) 50 MG tablet Take 50 mg by mouth daily.     Multiple Vitamin (MULTIVITAMIN WITH MINERALS) TABS Take 1 tablet by mouth daily.     raloxifene (EVISTA) 60 MG tablet Take 60 mg by mouth daily.     No current facility-administered medications on file prior to visit.    No Known Allergies  Family History  Problem Relation Age of Onset   Osteoporosis Mother    Osteoporosis Father     BP 124/80    Pulse 65    Ht 5' 10"  (1.778 m)    Wt 167 lb 9.6 oz (76 kg)    SpO2 98%    BMI 24.05 kg/m    Review of Systems denies weight loss, muscle cramps, memory loss, and back pain.  She seldom has falls.       Objective:   Physical Exam VITAL SIGNS:  See vs  page GENERAL: no distress Chest wall: no kyphosis GAIT: normal and steady.   DEXA: The BMD measured at AP Spine L1-L4 is 0.857 g/cm2 with a T-score of -2.7.  I have reviewed outside records, and summarized: Pt was noted to have elevated osteoporosis, and referred here.  Wellness, HTN, hyperglycemia, and dyslipidemia were also addressed      Assessment & Plan:  Osteoporosis, new to me Please continue the same Evista.   Patient Instructions  Blood tests are requested for you today.  We'll let you know about the results.  Take calcium 1200 mg per day, and vitamin-D, 400 units per day We are starting the process to approve "Prolia."  you will receive a phone call, about a day and time for an appointment. Please come back for a follow-up appointment in 6 months.  If necessary, we can add back the Fosamax.         Fall Prevention in the Home, Adult Falls can cause injuries and can affect people from all age groups. There are many simple things that you can do to make your home safe and to help prevent falls. Ask for help when making these changes, if needed. What actions can I take to prevent falls? General instructions  Use good lighting in all rooms. Replace any light bulbs that burn out.  Turn on lights if it is dark. Use night-lights.  Place frequently used items in easy-to-reach places. Lower the shelves around your home if necessary.  Set up furniture so that there are clear paths around it. Avoid moving your furniture around.  Remove throw rugs and other tripping hazards from the floor.  Avoid walking on wet floors.  Fix any uneven floor surfaces.  Add color or contrast paint or tape to grab bars and handrails in your home. Place contrasting color strips on the first and last steps of stairways.  When you use a stepladder, make sure that it is completely opened and that the sides are firmly locked. Have someone hold the ladder while you are using it. Do not climb a  closed stepladder.  Be aware of any and all pets. What can I do in the bathroom?      Keep the floor dry. Immediately clean up any water that spills onto the floor.  Remove soap buildup in the tub or shower on a regular basis.  Use non-skid mats or decals on the floor  of the tub or shower.  Attach bath mats securely with double-sided, non-slip rug tape.  If you need to sit down while you are in the shower, use a plastic, non-slip stool.  Install grab bars by the toilet and in the tub and shower. Do not use towel bars as grab bars. What can I do in the bedroom?  Make sure that a bedside light is easy to reach.  Do not use oversized bedding that drapes onto the floor.  Have a firm chair that has side arms to use for getting dressed. What can I do in the kitchen?  Clean up any spills right away.  If you need to reach for something above you, use a sturdy step stool that has a grab bar.  Keep electrical cables out of the way.  Do not use floor polish or wax that makes floors slippery. If you must use wax, make sure that it is non-skid floor wax. What can I do in the stairways?  Do not leave any items on the stairs.  Make sure that you have a light switch at the top of the stairs and the bottom of the stairs. Have them installed if you do not have them.  Make sure that there are handrails on both sides of the stairs. Fix handrails that are broken or loose. Make sure that handrails are as long as the stairways.  Install non-slip stair treads on all stairs in your home.  Avoid having throw rugs at the top or bottom of stairways, or secure the rugs with carpet tape to prevent them from moving.  Choose a carpet design that does not hide the edge of steps on the stairway.  Check any carpeting to make sure that it is firmly attached to the stairs. Fix any carpet that is loose or worn. What can I do on the outside of my home?  Use bright outdoor lighting.  Regularly repair  the edges of walkways and driveways and fix any cracks.  Remove high doorway thresholds.  Trim any shrubbery on the main path into your home.  Regularly check that handrails are securely fastened and in good repair. Both sides of any steps should have handrails.  Install guardrails along the edges of any raised decks or porches.  Clear walkways of debris and clutter, including tools and rocks.  Have leaves, snow, and ice cleared regularly.  Use sand or salt on walkways during winter months.  In the garage, clean up any spills right away, including grease or oil spills. What other actions can I take?  Wear closed-toe shoes that fit well and support your feet. Wear shoes that have rubber soles or low heels.  Use mobility aids as needed, such as canes, walkers, scooters, and crutches.  Review your medicines with your health care provider. Some medicines can cause dizziness or changes in blood pressure, which increase your risk of falling. Talk with your health care provider about other ways that you can decrease your risk of falls. This may include working with a physical therapist or trainer to improve your strength, balance, and endurance. Where to find more information  Centers for Disease Control and Prevention, [employer]: WebmailGuide.co.za  [employer] on Aging: BrainJudge.co.uk Contact a health care provider if:  You are afraid of falling at home.  You feel weak, drowsy, or dizzy at home.  You fall at home. Summary  There are many simple things that you can do to make your home safe and to help  prevent falls.  Ways to make your home safe include removing tripping hazards and installing grab bars in the bathroom.  Ask for help when making these changes in your home. This information is not intended to replace advice given to you by your health care provider. Make sure you discuss any questions you have with your health care provider. Document Revised:  09/23/2017 Document Reviewed: 05/26/2017 Elsevier Patient Education  2020 [employer].

## 2020-06-25 NOTE — Patient Instructions (Addendum)
Blood tests are requested for you today.  We'll let you know about the results.  Take calcium 1200 mg per day, and vitamin-D, 400 units per day We are starting the process to approve "Prolia."  you will receive a phone call, about a day and time for an appointment. Please come back for a follow-up appointment in 6 months.  If necessary, we can add back the Fosamax.         Fall Prevention in the Home, Adult Falls can cause injuries and can affect people from all age groups. There are many simple things that you can do to make your home safe and to help prevent falls. Ask for help when making these changes, if needed. What actions can I take to prevent falls? General instructions  Use good lighting in all rooms. Replace any light bulbs that burn out.  Turn on lights if it is dark. Use night-lights.  Place frequently used items in easy-to-reach places. Lower the shelves around your home if necessary.  Set up furniture so that there are clear paths around it. Avoid moving your furniture around.  Remove throw rugs and other tripping hazards from the floor.  Avoid walking on wet floors.  Fix any uneven floor surfaces.  Add color or contrast paint or tape to grab bars and handrails in your home. Place contrasting color strips on the first and last steps of stairways.  When you use a stepladder, make sure that it is completely opened and that the sides are firmly locked. Have someone hold the ladder while you are using it. Do not climb a closed stepladder.  Be aware of any and all pets. What can I do in the bathroom?      Keep the floor dry. Immediately clean up any water that spills onto the floor.  Remove soap buildup in the tub or shower on a regular basis.  Use non-skid mats or decals on the floor of the tub or shower.  Attach bath mats securely with double-sided, non-slip rug tape.  If you need to sit down while you are in the shower, use a plastic, non-slip  stool.  Install grab bars by the toilet and in the tub and shower. Do not use towel bars as grab bars. What can I do in the bedroom?  Make sure that a bedside light is easy to reach.  Do not use oversized bedding that drapes onto the floor.  Have a firm chair that has side arms to use for getting dressed. What can I do in the kitchen?  Clean up any spills right away.  If you need to reach for something above you, use a sturdy step stool that has a grab bar.  Keep electrical cables out of the way.  Do not use floor polish or wax that makes floors slippery. If you must use wax, make sure that it is non-skid floor wax. What can I do in the stairways?  Do not leave any items on the stairs.  Make sure that you have a light switch at the top of the stairs and the bottom of the stairs. Have them installed if you do not have them.  Make sure that there are handrails on both sides of the stairs. Fix handrails that are broken or loose. Make sure that handrails are as long as the stairways.  Install non-slip stair treads on all stairs in your home.  Avoid having throw rugs at the top or bottom of stairways, or secure  the rugs with carpet tape to prevent them from moving.  Choose a carpet design that does not hide the edge of steps on the stairway.  Check any carpeting to make sure that it is firmly attached to the stairs. Fix any carpet that is loose or worn. What can I do on the outside of my home?  Use bright outdoor lighting.  Regularly repair the edges of walkways and driveways and fix any cracks.  Remove high doorway thresholds.  Trim any shrubbery on the main path into your home.  Regularly check that handrails are securely fastened and in good repair. Both sides of any steps should have handrails.  Install guardrails along the edges of any raised decks or porches.  Clear walkways of debris and clutter, including tools and rocks.  Have leaves, snow, and ice cleared  regularly.  Use sand or salt on walkways during winter months.  In the garage, clean up any spills right away, including grease or oil spills. What other actions can I take?  Wear closed-toe shoes that fit well and support your feet. Wear shoes that have rubber soles or low heels.  Use mobility aids as needed, such as canes, walkers, scooters, and crutches.  Review your medicines with your health care provider. Some medicines can cause dizziness or changes in blood pressure, which increase your risk of falling. Talk with your health care provider about other ways that you can decrease your risk of falls. This may include working with a physical therapist or trainer to improve your strength, balance, and endurance. Where to find more information  Centers for Disease Control and Prevention, STEADI: WebmailGuide.co.za  [employer] on Aging: BrainJudge.co.uk Contact a health care provider if:  You are afraid of falling at home.  You feel weak, drowsy, or dizzy at home.  You fall at home. Summary  There are many simple things that you can do to make your home safe and to help prevent falls.  Ways to make your home safe include removing tripping hazards and installing grab bars in the bathroom.  Ask for help when making these changes in your home. This information is not intended to replace advice given to you by your health care provider. Make sure you discuss any questions you have with your health care provider. Document Revised: 09/23/2017 Document Reviewed: 05/26/2017 Elsevier Patient Education  2020 [employer].

## 2020-06-26 LAB — PTH, INTACT AND CALCIUM
Calcium: 10.4 mg/dL (ref 8.6–10.4)
PTH: 32 pg/mL (ref 14–64)

## 2020-06-27 ENCOUNTER — Telehealth: Payer: Self-pay

## 2020-06-27 NOTE — Telephone Encounter (Signed)
-----   Message from Deon Pilling, LPN sent at 1/0/2585  7:19 AM EDT -----  ----- Message ----- From: Romero Belling, MD Sent: 06/26/2020   2:47 PM EDT To: Deon Pilling, LPN  This pt needs to start Prolia.  Thank you.

## 2020-06-27 NOTE — Telephone Encounter (Signed)
Pt has been submitted to [employer] Portal to verify benefits and coverage.

## 2020-07-14 DIAGNOSIS — Z8739 Personal history of other diseases of the musculoskeletal system and connective tissue: Secondary | ICD-10-CM | POA: Diagnosis not present

## 2020-07-14 DIAGNOSIS — I10 Essential (primary) hypertension: Secondary | ICD-10-CM | POA: Diagnosis not present

## 2020-07-14 DIAGNOSIS — E785 Hyperlipidemia, unspecified: Secondary | ICD-10-CM | POA: Diagnosis not present

## 2020-07-14 DIAGNOSIS — Z23 Encounter for immunization: Secondary | ICD-10-CM | POA: Diagnosis not present

## 2020-07-29 NOTE — Telephone Encounter (Signed)
Summary of Benefits have come in pt na PA is required and the pt owes $275 at check-in I contacted the pt to get her scheduled and made her aware of the out-of-pocket cost

## 2020-07-31 ENCOUNTER — Other Ambulatory Visit: Payer: Self-pay

## 2020-07-31 ENCOUNTER — Ambulatory Visit: Payer: Medicare Other

## 2020-07-31 DIAGNOSIS — M810 Age-related osteoporosis without current pathological fracture: Secondary | ICD-10-CM | POA: Diagnosis not present

## 2020-07-31 MED ORDER — DENOSUMAB 60 MG/ML ~~LOC~~ SOSY
60.0000 mg | PREFILLED_SYRINGE | Freq: Once | SUBCUTANEOUS | Status: AC
  Administered 2020-07-31: 60 mg via SUBCUTANEOUS

## 2020-08-02 NOTE — Addendum Note (Signed)
Addended by: Judd Gaudier on: 08/02/2020 08:31 PM   Modules accepted: Level of Service

## 2020-08-02 NOTE — Progress Notes (Signed)
Prolia injection given.  Paitent tolerated well.

## 2020-08-19 DIAGNOSIS — H5213 Myopia, bilateral: Secondary | ICD-10-CM | POA: Diagnosis not present

## 2020-08-19 DIAGNOSIS — H401133 Primary open-angle glaucoma, bilateral, severe stage: Secondary | ICD-10-CM | POA: Diagnosis not present

## 2020-08-19 DIAGNOSIS — H2513 Age-related nuclear cataract, bilateral: Secondary | ICD-10-CM | POA: Diagnosis not present

## 2020-08-19 DIAGNOSIS — H47391 Other disorders of optic disc, right eye: Secondary | ICD-10-CM | POA: Diagnosis not present

## 2020-09-08 DIAGNOSIS — E785 Hyperlipidemia, unspecified: Secondary | ICD-10-CM | POA: Diagnosis not present

## 2020-09-08 DIAGNOSIS — Z8739 Personal history of other diseases of the musculoskeletal system and connective tissue: Secondary | ICD-10-CM | POA: Diagnosis not present

## 2020-09-08 DIAGNOSIS — I10 Essential (primary) hypertension: Secondary | ICD-10-CM | POA: Diagnosis not present

## 2020-09-08 DIAGNOSIS — R413 Other amnesia: Secondary | ICD-10-CM | POA: Diagnosis not present

## 2020-11-07 DIAGNOSIS — Z1152 Encounter for screening for COVID-19: Secondary | ICD-10-CM | POA: Diagnosis not present

## 2020-11-19 DIAGNOSIS — H409 Unspecified glaucoma: Secondary | ICD-10-CM | POA: Diagnosis not present

## 2020-11-19 DIAGNOSIS — Z0000 Encounter for general adult medical examination without abnormal findings: Secondary | ICD-10-CM | POA: Diagnosis not present

## 2020-11-19 DIAGNOSIS — E875 Hyperkalemia: Secondary | ICD-10-CM | POA: Diagnosis not present

## 2020-11-19 DIAGNOSIS — R946 Abnormal results of thyroid function studies: Secondary | ICD-10-CM | POA: Diagnosis not present

## 2020-11-19 DIAGNOSIS — I10 Essential (primary) hypertension: Secondary | ICD-10-CM | POA: Diagnosis not present

## 2020-11-19 DIAGNOSIS — Z8739 Personal history of other diseases of the musculoskeletal system and connective tissue: Secondary | ICD-10-CM | POA: Diagnosis not present

## 2020-11-19 DIAGNOSIS — R7309 Other abnormal glucose: Secondary | ICD-10-CM | POA: Diagnosis not present

## 2020-11-19 DIAGNOSIS — E785 Hyperlipidemia, unspecified: Secondary | ICD-10-CM | POA: Diagnosis not present

## 2020-11-20 DIAGNOSIS — R7309 Other abnormal glucose: Secondary | ICD-10-CM | POA: Diagnosis not present

## 2020-11-20 DIAGNOSIS — Z8739 Personal history of other diseases of the musculoskeletal system and connective tissue: Secondary | ICD-10-CM | POA: Diagnosis not present

## 2020-11-20 DIAGNOSIS — R946 Abnormal results of thyroid function studies: Secondary | ICD-10-CM | POA: Diagnosis not present

## 2020-11-20 DIAGNOSIS — I10 Essential (primary) hypertension: Secondary | ICD-10-CM | POA: Diagnosis not present

## 2020-11-20 DIAGNOSIS — E785 Hyperlipidemia, unspecified: Secondary | ICD-10-CM | POA: Diagnosis not present

## 2020-11-26 DIAGNOSIS — E875 Hyperkalemia: Secondary | ICD-10-CM | POA: Diagnosis not present

## 2020-12-17 DIAGNOSIS — H401133 Primary open-angle glaucoma, bilateral, severe stage: Secondary | ICD-10-CM | POA: Diagnosis not present

## 2020-12-17 DIAGNOSIS — H2513 Age-related nuclear cataract, bilateral: Secondary | ICD-10-CM | POA: Diagnosis not present

## 2020-12-17 DIAGNOSIS — H47391 Other disorders of optic disc, right eye: Secondary | ICD-10-CM | POA: Diagnosis not present

## 2020-12-17 DIAGNOSIS — H5213 Myopia, bilateral: Secondary | ICD-10-CM | POA: Diagnosis not present

## 2020-12-26 ENCOUNTER — Ambulatory Visit: Payer: Medicare Other | Admitting: Endocrinology

## 2021-01-06 DIAGNOSIS — H2513 Age-related nuclear cataract, bilateral: Secondary | ICD-10-CM | POA: Diagnosis not present

## 2021-01-06 DIAGNOSIS — H401133 Primary open-angle glaucoma, bilateral, severe stage: Secondary | ICD-10-CM | POA: Diagnosis not present

## 2021-01-06 DIAGNOSIS — H5213 Myopia, bilateral: Secondary | ICD-10-CM | POA: Diagnosis not present

## 2021-01-06 DIAGNOSIS — H47391 Other disorders of optic disc, right eye: Secondary | ICD-10-CM | POA: Diagnosis not present

## 2021-02-03 DIAGNOSIS — M810 Age-related osteoporosis without current pathological fracture: Secondary | ICD-10-CM | POA: Diagnosis not present

## 2021-02-05 ENCOUNTER — Other Ambulatory Visit: Payer: Self-pay | Admitting: Family Medicine

## 2021-02-05 DIAGNOSIS — Z1231 Encounter for screening mammogram for malignant neoplasm of breast: Secondary | ICD-10-CM

## 2021-02-18 DIAGNOSIS — H47391 Other disorders of optic disc, right eye: Secondary | ICD-10-CM | POA: Diagnosis not present

## 2021-02-18 DIAGNOSIS — H401133 Primary open-angle glaucoma, bilateral, severe stage: Secondary | ICD-10-CM | POA: Diagnosis not present

## 2021-02-18 DIAGNOSIS — H2513 Age-related nuclear cataract, bilateral: Secondary | ICD-10-CM | POA: Diagnosis not present

## 2021-02-18 DIAGNOSIS — H5213 Myopia, bilateral: Secondary | ICD-10-CM | POA: Diagnosis not present

## 2021-03-27 ENCOUNTER — Ambulatory Visit
Admission: RE | Admit: 2021-03-27 | Discharge: 2021-03-27 | Disposition: A | Discharge status: Home / Self Care | Payer: Medicare Other | Source: Ambulatory Visit | Attending: Family Medicine | Admitting: Family Medicine

## 2021-03-27 ENCOUNTER — Other Ambulatory Visit: Payer: Self-pay

## 2021-03-27 DIAGNOSIS — Z1231 Encounter for screening mammogram for malignant neoplasm of breast: Secondary | ICD-10-CM | POA: Diagnosis not present

## 2021-05-13 DIAGNOSIS — E785 Hyperlipidemia, unspecified: Secondary | ICD-10-CM | POA: Diagnosis not present

## 2021-05-28 DIAGNOSIS — E875 Hyperkalemia: Secondary | ICD-10-CM | POA: Diagnosis not present

## 2021-05-28 DIAGNOSIS — I10 Essential (primary) hypertension: Secondary | ICD-10-CM | POA: Diagnosis not present

## 2021-06-11 DIAGNOSIS — E875 Hyperkalemia: Secondary | ICD-10-CM | POA: Diagnosis not present

## 2021-06-12 DIAGNOSIS — R946 Abnormal results of thyroid function studies: Secondary | ICD-10-CM | POA: Diagnosis not present

## 2021-06-12 DIAGNOSIS — R7309 Other abnormal glucose: Secondary | ICD-10-CM | POA: Diagnosis not present

## 2021-06-12 DIAGNOSIS — I10 Essential (primary) hypertension: Secondary | ICD-10-CM | POA: Diagnosis not present

## 2021-06-12 DIAGNOSIS — E785 Hyperlipidemia, unspecified: Secondary | ICD-10-CM | POA: Diagnosis not present

## 2021-06-12 DIAGNOSIS — E559 Vitamin D deficiency, unspecified: Secondary | ICD-10-CM | POA: Diagnosis not present

## 2021-06-12 DIAGNOSIS — E875 Hyperkalemia: Secondary | ICD-10-CM | POA: Diagnosis not present

## 2021-06-24 DIAGNOSIS — H5213 Myopia, bilateral: Secondary | ICD-10-CM | POA: Diagnosis not present

## 2021-06-24 DIAGNOSIS — H0015 Chalazion left lower eyelid: Secondary | ICD-10-CM | POA: Diagnosis not present

## 2021-06-24 DIAGNOSIS — H2513 Age-related nuclear cataract, bilateral: Secondary | ICD-10-CM | POA: Diagnosis not present

## 2021-06-24 DIAGNOSIS — H47391 Other disorders of optic disc, right eye: Secondary | ICD-10-CM | POA: Diagnosis not present

## 2021-06-24 DIAGNOSIS — H401133 Primary open-angle glaucoma, bilateral, severe stage: Secondary | ICD-10-CM | POA: Diagnosis not present

## 2021-08-10 DIAGNOSIS — M810 Age-related osteoporosis without current pathological fracture: Secondary | ICD-10-CM | POA: Diagnosis not present

## 2021-08-18 DIAGNOSIS — H2513 Age-related nuclear cataract, bilateral: Secondary | ICD-10-CM | POA: Diagnosis not present

## 2021-08-18 DIAGNOSIS — H5213 Myopia, bilateral: Secondary | ICD-10-CM | POA: Diagnosis not present

## 2021-08-18 DIAGNOSIS — H35033 Hypertensive retinopathy, bilateral: Secondary | ICD-10-CM | POA: Diagnosis not present

## 2021-08-18 DIAGNOSIS — H401133 Primary open-angle glaucoma, bilateral, severe stage: Secondary | ICD-10-CM | POA: Diagnosis not present

## 2021-08-18 DIAGNOSIS — H25041 Posterior subcapsular polar age-related cataract, right eye: Secondary | ICD-10-CM | POA: Diagnosis not present

## 2021-09-10 DIAGNOSIS — H25041 Posterior subcapsular polar age-related cataract, right eye: Secondary | ICD-10-CM | POA: Diagnosis not present

## 2021-09-10 DIAGNOSIS — H2513 Age-related nuclear cataract, bilateral: Secondary | ICD-10-CM | POA: Diagnosis not present

## 2021-09-10 DIAGNOSIS — H35033 Hypertensive retinopathy, bilateral: Secondary | ICD-10-CM | POA: Diagnosis not present

## 2021-09-10 DIAGNOSIS — H401133 Primary open-angle glaucoma, bilateral, severe stage: Secondary | ICD-10-CM | POA: Diagnosis not present

## 2021-11-09 DIAGNOSIS — Z8739 Personal history of other diseases of the musculoskeletal system and connective tissue: Secondary | ICD-10-CM | POA: Diagnosis not present

## 2021-11-09 DIAGNOSIS — I10 Essential (primary) hypertension: Secondary | ICD-10-CM | POA: Diagnosis not present

## 2021-11-09 DIAGNOSIS — E785 Hyperlipidemia, unspecified: Secondary | ICD-10-CM | POA: Diagnosis not present

## 2021-11-09 DIAGNOSIS — R7309 Other abnormal glucose: Secondary | ICD-10-CM | POA: Diagnosis not present

## 2021-11-09 DIAGNOSIS — R946 Abnormal results of thyroid function studies: Secondary | ICD-10-CM | POA: Diagnosis not present

## 2021-11-09 DIAGNOSIS — E559 Vitamin D deficiency, unspecified: Secondary | ICD-10-CM | POA: Diagnosis not present

## 2021-11-16 DIAGNOSIS — H6123 Impacted cerumen, bilateral: Secondary | ICD-10-CM | POA: Diagnosis not present

## 2021-11-16 DIAGNOSIS — Z8739 Personal history of other diseases of the musculoskeletal system and connective tissue: Secondary | ICD-10-CM | POA: Diagnosis not present

## 2021-11-16 DIAGNOSIS — R0789 Other chest pain: Secondary | ICD-10-CM | POA: Diagnosis not present

## 2021-11-16 DIAGNOSIS — R058 Other specified cough: Secondary | ICD-10-CM | POA: Diagnosis not present

## 2021-11-16 DIAGNOSIS — R7309 Other abnormal glucose: Secondary | ICD-10-CM | POA: Diagnosis not present

## 2021-11-16 DIAGNOSIS — Z23 Encounter for immunization: Secondary | ICD-10-CM | POA: Diagnosis not present

## 2021-11-16 DIAGNOSIS — H9209 Otalgia, unspecified ear: Secondary | ICD-10-CM | POA: Diagnosis not present

## 2021-11-16 DIAGNOSIS — E559 Vitamin D deficiency, unspecified: Secondary | ICD-10-CM | POA: Diagnosis not present

## 2021-11-16 DIAGNOSIS — Z0000 Encounter for general adult medical examination without abnormal findings: Secondary | ICD-10-CM | POA: Diagnosis not present

## 2021-11-16 DIAGNOSIS — E785 Hyperlipidemia, unspecified: Secondary | ICD-10-CM | POA: Diagnosis not present

## 2021-11-16 DIAGNOSIS — I10 Essential (primary) hypertension: Secondary | ICD-10-CM | POA: Diagnosis not present

## 2022-01-07 DIAGNOSIS — H401133 Primary open-angle glaucoma, bilateral, severe stage: Secondary | ICD-10-CM | POA: Diagnosis not present

## 2022-02-10 DIAGNOSIS — M810 Age-related osteoporosis without current pathological fracture: Secondary | ICD-10-CM | POA: Diagnosis not present

## 2022-03-04 ENCOUNTER — Other Ambulatory Visit: Payer: Self-pay | Admitting: Family Medicine

## 2022-03-04 DIAGNOSIS — Z1231 Encounter for screening mammogram for malignant neoplasm of breast: Secondary | ICD-10-CM

## 2022-03-29 ENCOUNTER — Ambulatory Visit
Admission: RE | Admit: 2022-03-29 | Discharge: 2022-03-29 | Disposition: A | Discharge status: Home / Self Care | Payer: PPO | Source: Ambulatory Visit | Attending: Family Medicine | Admitting: Family Medicine

## 2022-03-29 DIAGNOSIS — Z1231 Encounter for screening mammogram for malignant neoplasm of breast: Secondary | ICD-10-CM

## 2022-05-10 DIAGNOSIS — I10 Essential (primary) hypertension: Secondary | ICD-10-CM | POA: Diagnosis not present

## 2022-05-10 DIAGNOSIS — R7309 Other abnormal glucose: Secondary | ICD-10-CM | POA: Diagnosis not present

## 2022-05-10 DIAGNOSIS — E559 Vitamin D deficiency, unspecified: Secondary | ICD-10-CM | POA: Diagnosis not present

## 2022-05-10 DIAGNOSIS — Z23 Encounter for immunization: Secondary | ICD-10-CM | POA: Diagnosis not present

## 2022-05-10 DIAGNOSIS — E785 Hyperlipidemia, unspecified: Secondary | ICD-10-CM | POA: Diagnosis not present

## 2022-05-10 DIAGNOSIS — Z8739 Personal history of other diseases of the musculoskeletal system and connective tissue: Secondary | ICD-10-CM | POA: Diagnosis not present

## 2022-05-17 DIAGNOSIS — E559 Vitamin D deficiency, unspecified: Secondary | ICD-10-CM | POA: Diagnosis not present

## 2022-05-17 DIAGNOSIS — M8589 Other specified disorders of bone density and structure, multiple sites: Secondary | ICD-10-CM | POA: Diagnosis not present

## 2022-05-17 DIAGNOSIS — R946 Abnormal results of thyroid function studies: Secondary | ICD-10-CM | POA: Diagnosis not present

## 2022-05-17 DIAGNOSIS — R7309 Other abnormal glucose: Secondary | ICD-10-CM | POA: Diagnosis not present

## 2022-05-17 DIAGNOSIS — I10 Essential (primary) hypertension: Secondary | ICD-10-CM | POA: Diagnosis not present

## 2022-05-17 DIAGNOSIS — Z8739 Personal history of other diseases of the musculoskeletal system and connective tissue: Secondary | ICD-10-CM | POA: Diagnosis not present

## 2022-05-17 DIAGNOSIS — E785 Hyperlipidemia, unspecified: Secondary | ICD-10-CM | POA: Diagnosis not present

## 2022-06-15 DIAGNOSIS — H401133 Primary open-angle glaucoma, bilateral, severe stage: Secondary | ICD-10-CM | POA: Diagnosis not present

## 2022-08-17 DIAGNOSIS — M810 Age-related osteoporosis without current pathological fracture: Secondary | ICD-10-CM | POA: Diagnosis not present

## 2022-08-27 DIAGNOSIS — Z6822 Body mass index (BMI) 22.0-22.9, adult: Secondary | ICD-10-CM | POA: Diagnosis not present

## 2022-08-27 DIAGNOSIS — I10 Essential (primary) hypertension: Secondary | ICD-10-CM | POA: Diagnosis not present

## 2022-08-30 ENCOUNTER — Telehealth: Payer: PPO | Admitting: Cardiology

## 2022-08-30 ENCOUNTER — Emergency Department (HOSPITAL_COMMUNITY)
Admission: EM | Admit: 2022-08-30 | Discharge: 2022-08-30 | Disposition: A | Discharge status: Home / Self Care | Payer: PPO | Attending: Emergency Medicine | Admitting: Emergency Medicine

## 2022-08-30 ENCOUNTER — Encounter (HOSPITAL_COMMUNITY): Payer: Self-pay

## 2022-08-30 ENCOUNTER — Emergency Department (HOSPITAL_COMMUNITY): Payer: PPO

## 2022-08-30 DIAGNOSIS — Z79899 Other long term (current) drug therapy: Secondary | ICD-10-CM | POA: Diagnosis not present

## 2022-08-30 DIAGNOSIS — I10 Essential (primary) hypertension: Secondary | ICD-10-CM | POA: Diagnosis not present

## 2022-08-30 DIAGNOSIS — I493 Ventricular premature depolarization: Secondary | ICD-10-CM

## 2022-08-30 DIAGNOSIS — R002 Palpitations: Secondary | ICD-10-CM | POA: Insufficient documentation

## 2022-08-30 DIAGNOSIS — R079 Chest pain, unspecified: Secondary | ICD-10-CM | POA: Insufficient documentation

## 2022-08-30 DIAGNOSIS — R9431 Abnormal electrocardiogram [ECG] [EKG]: Secondary | ICD-10-CM

## 2022-08-30 LAB — CBC
HCT: 45 % (ref 36.0–46.0)
Hemoglobin: 14.6 g/dL (ref 12.0–15.0)
MCH: 32.9 pg (ref 26.0–34.0)
MCHC: 32.4 g/dL (ref 30.0–36.0)
MCV: 101.4 fL — ABNORMAL HIGH (ref 80.0–100.0)
Platelets: 322 10*3/uL (ref 150–400)
RBC: 4.44 MIL/uL (ref 3.87–5.11)
RDW: 12.3 % (ref 11.5–15.5)
WBC: 8.9 10*3/uL (ref 4.0–10.5)
nRBC: 0 % (ref 0.0–0.2)

## 2022-08-30 LAB — BASIC METABOLIC PANEL
Anion gap: 4 — ABNORMAL LOW (ref 5–15)
BUN: 14 mg/dL (ref 8–23)
CO2: 25 mmol/L (ref 22–32)
Calcium: 9.5 mg/dL (ref 8.9–10.3)
Chloride: 110 mmol/L (ref 98–111)
Creatinine, Ser: 0.78 mg/dL (ref 0.44–1.00)
GFR, Estimated: >60 mL/min (ref >60)
Glucose, Bld: 129 mg/dL — ABNORMAL HIGH (ref 70–99)
Potassium: 4 mmol/L (ref 3.5–5.1)
Sodium: 139 mmol/L (ref 135–145)

## 2022-08-30 LAB — TROPONIN I (HIGH SENSITIVITY)
Troponin I (High Sensitivity): 4 ng/L (ref <18)
Troponin I (High Sensitivity): 5 ng/L (ref <18)

## 2022-08-30 NOTE — ED Triage Notes (Signed)
Pt reports intermittent left sided chest discomfort with left arm tingling and bradycardia x1 day. Pt reports when having discomfort at home HR drops to 40s.  Pt on eye drops for glaucoma and states a side effect is bradycardia.

## 2022-08-30 NOTE — Progress Notes (Signed)
Went to do nursing admission history on patient. She states that she is not staying and that they are sending her home.  Doroteo Bradford BSN, RN-BC Throughput Nurse 08/30/2022 9:17 PM

## 2022-08-30 NOTE — Discharge Instructions (Signed)
Call your ophthalmologist tomorrow and see if there is a different eyedrop he can take that does not have timolol in it.  Follow-up with the cardiologist Dr. Terri Skains or one of his associates this week.  Return if any problems

## 2022-08-30 NOTE — ED Provider Triage Note (Signed)
Emergency Medicine Provider Triage Evaluation Note  Charlotte Gibson , a 66 y.o. female  was evaluated in triage.  Pt complains of left-sided chest discomfort onset yesterday.  Has associated left arm tingling as well as bradycardia.  Also notes palpitations and notes that she had a low pulse at home in the 40s.  No meds tried prior to arrival.  Denies past medical history of MI, diabetes, cardiac catheterization, stents.  Review of Systems  Positive: Negative:   Physical Exam  BP (!) 156/70 (BP Location: Left Arm)   Pulse 77   Temp 98 F (36.7 C) (Oral)   Resp 20   Ht 5\' 10"  (1.778 m)   Wt 71.7 kg   SpO2 99%   BMI 22.67 kg/m  Gen:   Awake, no distress   Resp:  Normal effort  MSK:   Moves extremities without difficulty  Other:  No chest wall tenderness to palpation  Medical Decision Making  Medically screening exam initiated at 3:21 PM.  Appropriate orders placed.  Sharnee Douglass was informed that the remainder of the evaluation will be completed by another provider, this initial triage assessment does not replace that evaluation, and the importance of remaining in the ED until their evaluation is complete.  Work-up initiated   Bret Vanessen A, PA-C 08/30/22 1526

## 2022-08-30 NOTE — ED Provider Notes (Signed)
[facility]-EMERGENCY DEPT Provider Note   CSN: [id number] Arrival date & time: 08/30/22  1426     History {Add pertinent medical, surgical, social history, OB history to HPI:1} Chief Complaint  Patient presents with   Chest Pain    Charlotte Gibson is a 66 y.o. female.  Patient has a history of hypertension.  She has noticed some palpitations recently   Chest Pain      Home Medications Prior to Admission medications   Medication Sig Start Date End Date Taking? Authorizing Provider  atorvastatin (LIPITOR) 40 MG tablet Take 40 mg by mouth daily. 02/18/20   [provider]  brimonidine (ALPHAGAN) 0.2 % ophthalmic solution 1 drop 2 (two) times daily. 03/17/20   [provider]  Calcium Carbonate (CALCIUM 500 PO) Take 1 tablet by mouth daily.    [provider]  dorzolamide-timolol (COSOPT) 22.3-6.8 MG/ML ophthalmic solution 1 drop 2 (two) times daily.    [provider]  latanoprost (XALATAN) 0.005 % ophthalmic solution Place 1 drop into both eyes 2 (two) times daily.     [provider]  losartan (COZAAR) 50 MG tablet Take 50 mg by mouth daily. 05/26/20   [provider]  Multiple Vitamin (MULTIVITAMIN WITH MINERALS) TABS Take 1 tablet by mouth daily.    [provider]  raloxifene (EVISTA) 60 MG tablet Take 60 mg by mouth daily. 04/29/20   [provider]      Allergies    Patient has no known allergies.    Review of Systems   Review of Systems  Cardiovascular:  Positive for chest pain.    Physical Exam Updated Vital Signs BP 115/79   Pulse 75   Temp 98 F (36.7 C)   Resp 20   Ht 5\' 10"  (1.778 m)   Wt 71.7 kg   SpO2 100%   BMI 22.67 kg/m  Physical Exam  ED Results / Procedures / Treatments   Labs (all labs ordered are listed, but only abnormal results are displayed) Labs Reviewed  BASIC METABOLIC PANEL - Abnormal; Notable for the following components:      Result Value    Glucose, Bld 129 (*)    Anion gap 4 (*)    All other components within normal limits  CBC - Abnormal; Notable for the following components:   MCV 101.4 (*)    All other components within normal limits  TROPONIN I (HIGH SENSITIVITY)  TROPONIN I (HIGH SENSITIVITY)    EKG EKG Interpretation  Date/Time:  Monday August 30 2022 19:00:24 EST Ventricular Rate:  95 PR Interval:  114 QRS Duration: 85 QT Interval:  409 QTC Calculation: 402 R Axis:   67 Text Interpretation: Sinus rhythm Ventricular bigeminy Borderline short PR interval Borderline repolarization abnormality Confirmed by 10[birth year]1992 [phone]) on 08/30/2022 7:11:57 PM  Radiology DG Chest Port 1 View  Result Date: 08/30/2022 CLINICAL DATA:  Chest pain EXAM: PORTABLE CHEST 1 VIEW COMPARISON:  12/01/2017 FINDINGS: The heart size and mediastinal contours are within normal limits. Both lungs are clear. The visualized skeletal structures are unremarkable. IMPRESSION: No active disease. Electronically Signed   By: 01/29/2018 M.D.   On: 08/30/2022 15:36    Procedures Procedures  {Document cardiac monitor, telemetry assessment procedure when appropriate:1}  Medications Ordered in ED Medications - No data to display  ED Course/ Medical Decision Making/ A&P  Patient with palpitations and extra ectopic beats.  I spoke with cardiology and they would like the patient to stop  her Tom[place] drops in her eyes and follow-up with cardiology this week and also contact her ophthalmologist                         Medical Decision Making  Palpitations from an ectopic atrial beats.  Patient will follow-up with cardiology  {Document critical care time when appropriate:1} {Document review of labs and clinical decision tools ie heart score, Chads2Vasc2 etc:1}  {Document your independent review of radiology images, and any outside records:1} {Document your discussion with family members, caretakers, and with consultants:1} {Document social  determinants of health affecting pt's care:1} {Document your decision making why or why not admission, treatments were needed:1} Final Clinical Impression(s) / ED Diagnoses Final diagnoses:  Palpitations    Rx / DC Orders ED Discharge Orders          Ordered    Ambulatory referral to Cardiology       Comments: If you have not heard from the Cardiology office within the next 72 hours please call [phone].   08/30/22 2112

## 2022-08-30 NOTE — Telephone Encounter (Signed)
ON-CALL CARDIOLOGY 08/30/22  Patient's name: Charlotte Gibson.   MRN: [id number].    DOB: [birth year] Primary care provider: Janie Morning, DO. Primary cardiologist: NA  Interaction regarding this patient's care today: ED provider called Dr. Roderic Palau regarding the patient's presentation.   Patient was have in irregular heartbeat and palpitations so came to ED for evaluation.   Currently on timolol for her Glaucoma started about 3 weeks ago.   Called to review the EKG and plan of care.   Clinically I was told she is asymptomatic, hemodynamically stable, and review of labs note no reversible cause except possible contributing factor being eye drops.      Impression: Abnormal EKG  Premature Ventricular Contraction  Recommendation: EKG illustrates either sinus w/ 2:1 conduction w/ frequent PVC in ventricular bigeminy pattern. But ectopic atrial rhythm w/ PVCs in bigeminy pattern can be excluded.   Recommend holding timolol for tonight. Reaching out to the ophthalmologist in the morning for possible alternative.   Outpatient follow up and consider Echo and Zio patch.   Check TSH   Asked to call the office for appt in case she does not hear back in 24-48 hrs.   Telephone encounter total time: 17 minutes  Mechele Claude [facility]  Pager: [phone] Office: [phone]

## 2022-08-30 NOTE — ED Notes (Signed)
An After Visit Summary was printed and given to the patient. Discharge instructions given and no further questions at this time.  

## 2022-08-31 ENCOUNTER — Ambulatory Visit: Payer: PPO | Admitting: Internal Medicine

## 2022-08-31 ENCOUNTER — Encounter: Payer: Self-pay | Admitting: Internal Medicine

## 2022-08-31 VITALS — BP 131/85 | HR 99 | Resp 15 | Ht 70.0 in | Wt 158.8 lb

## 2022-08-31 DIAGNOSIS — I498 Other specified cardiac arrhythmias: Secondary | ICD-10-CM | POA: Insufficient documentation

## 2022-08-31 DIAGNOSIS — E782 Mixed hyperlipidemia: Secondary | ICD-10-CM

## 2022-08-31 DIAGNOSIS — I10 Essential (primary) hypertension: Secondary | ICD-10-CM

## 2022-08-31 DIAGNOSIS — R9431 Abnormal electrocardiogram [ECG] [EKG]: Secondary | ICD-10-CM | POA: Diagnosis not present

## 2022-08-31 NOTE — Progress Notes (Signed)
Primary Physician/Referring:  Charlotte Morning, DO  Patient ID: Charlotte Gibson, female    DOB: 1956-07-24, 66 y.o.   MRN: [id number]  Chief Complaint  Patient presents with   Abnormal ECG   Follow-up   HPI:    Charlotte Gibson  is a 66 y.o. female with past medical history significant for hypertension, hyperlipidemia, and palpitations who is here to establish care with cardiology.  Patient went to the emergency department last night due to severe and persistent palpitations.  She was found to be in bigeminy however ectopic atrial rhythm cannot be ruled out.  Patient does admit to some chest pressure and shortness of breath but she is not sure if this is related to anxiety.  She has had PVCs in the past, however, she states they have never been this frequent.  She says when she counts her pulse it is in the 40s and it is very scary for her.  She denies any cardiac history.  She has not had an echocardiogram or stress test ever in the past.  Patient does not smoke or drink alcohol.  She denies diaphoresis, syncope, edema, orthopnea, PND, claudication.  Patient will continue holding her eyedrops until she speaks with her ophthalmologist as they could contribute to her current arrhythmia.  Past Medical History:  Diagnosis Date   GERD (gastroesophageal reflux disease)    Hyperlipidemia    Hypertension    Osteoporosis    Past Surgical History:  Procedure Laterality Date   ABDOMINAL HYSTERECTOMY     at 66   APPENDECTOMY     at 66   Family History  Problem Relation Age of Onset   Osteoporosis Mother    Stroke Father    Osteoporosis Father    Atrial fibrillation Father    Heart failure Sister     Social History   Tobacco Use   Smoking status: Never   Smokeless tobacco: Never  Substance Use Topics   Alcohol use: Yes    Alcohol/week: 1.0 standard drink of alcohol    Types: 1 Cans of beer per week    Comment: occ   Marital Status: Divorced  ROS  Review of Systems  Cardiovascular:   Positive for chest pain, dyspnea on exertion, irregular heartbeat and palpitations.   Objective  Blood pressure 131/85, pulse 99, resp. rate 15, height 5\' 10"  (1.778 m), weight 158 lb 12.8 oz (72 kg), SpO2 99 %. Body mass index is 22.79 kg/m.     08/31/2022    1:28 PM 08/30/2022    8:45 PM 08/30/2022    8:30 PM  Vitals with BMI  Height 5\' 10"     Weight 158 lbs 13 oz    BMI 93.23    Systolic 557 322 025  Diastolic 85 79 77  Pulse 99 75 73     Physical Exam Vitals reviewed.  HENT:     Head: Normocephalic and atraumatic.  Cardiovascular:     Rate and Rhythm: Normal rate and regular rhythm.     Pulses: Normal pulses.     Heart sounds: Murmur heard.  Pulmonary:     Effort: Pulmonary effort is normal.     Breath sounds: Normal breath sounds.  Abdominal:     General: Bowel sounds are normal.  Musculoskeletal:     Right lower leg: No edema.     Left lower leg: No edema.  Skin:    General: Skin is warm and dry.  Neurological:     Mental Status: She is  alert.     Medications and allergies  No Known Allergies   Medication list after today's encounter   Current Outpatient Medications:    atorvastatin (LIPITOR) 40 MG tablet, Take 40 mg by mouth daily., Disp: , Rfl:    brimonidine (ALPHAGAN) 0.2 % ophthalmic solution, 1 drop 2 (two) times daily., Disp: , Rfl:    Calcium Carbonate (CALCIUM 500 PO), Take 1 tablet by mouth daily., Disp: , Rfl:    dorzolamide-timolol (COSOPT) 22.3-6.8 MG/ML ophthalmic solution, 1 drop 2 (two) times daily., Disp: , Rfl:    latanoprost (XALATAN) 0.005 % ophthalmic solution, Place 1 drop into both eyes 2 (two) times daily. , Disp: , Rfl:    losartan (COZAAR) 50 MG tablet, Take 50 mg by mouth daily., Disp: , Rfl:    Multiple Vitamin (MULTIVITAMIN WITH MINERALS) TABS, Take 1 tablet by mouth daily., Disp: , Rfl:    RHOPRESSA 0.02 % SOLN, INSTILL 1 DROP IN BOTH EYES EVERY EVENING, Disp: , Rfl:   Laboratory examination:   Lab Results  Component Value  Date   NA 139 08/30/2022   K 4.0 08/30/2022   CO2 25 08/30/2022   GLUCOSE 129 (H) 08/30/2022   BUN 14 08/30/2022   CREATININE 0.78 08/30/2022   CALCIUM 9.5 08/30/2022   GFRNONAA >60 08/30/2022       Latest Ref Rng & Units 08/30/2022    3:42 PM 06/25/2020    1:55 PM 01/14/2020   12:00 AM  CMP  Glucose 70 - 99 mg/dL 237  628    BUN 8 - 23 mg/dL 14  19  17       Creatinine 0.44 - 1.00 mg/dL  3.15  0.8      Sodium 135 - 145 mmol/L 139  142  142      Potassium 3.5 - 5.1 mmol/L 4.0  4.3    Chloride 98 - 111 mmol/L 110  106    CO2 22 - 32 mmol/L 25  29    Calcium 8.9 - 10.3 mg/dL 9.5  1.76    16.0  9.2         This result is from an external source.   Multiple values from one day are sorted in reverse-chronological order      Latest Ref Rng & Units 08/30/2022    3:42 PM 04/16/2013    8:25 PM  CBC  WBC 4.0 - 10.5 K/uL 8.9  7.0   Hemoglobin 12.0 - 15.0 g/dL 04/18/2013  10.6   Hematocrit 36.0 - 46.0 % 45.0  39.4   Platelets 150 - 400 K/uL 322  296     Lipid Panel No results for input(s): "CHOL", "TRIG", "LDLCALC", "VLDL", "HDL", "CHOLHDL", "LDLDIRECT" in the last 8760 hours.  HEMOGLOBIN A1C No results found for: "HGBA1C", "MPG" TSH No results for input(s): "TSH" in the last 8760 hours.  External labs:     Radiology:    Cardiac Studies:   No results found for this or any previous visit from the past 1095 days.     No results found for this or any previous visit from the past 1095 days.     EKG:   08/31/2022: sinus w/ 2:1 conduction w/ frequent PVC in ventricular bigeminy pattern, ectopic atrial rhythm w/ PVCs in bigeminy pattern can be excluded.   Assessment     ICD-10-CM   1. Abnormal EKG  R94.31 EKG 12-Lead    2. Ventricular bigeminy  I49.8     3. Mixed hyperlipidemia  E78.2     4. Essential hypertension  I10        Orders Placed This Encounter  Procedures   EKG 12-Lead    No orders of the defined types were placed in this  encounter.   Medications Discontinued During This Encounter  Medication Reason   raloxifene (EVISTA) 60 MG tablet      Recommendations:   Charlotte Gibson is a 66 y.o.  F with palpitations, HTN, and HLD    Ventricular bigeminy EKG 11/6 and 11/7 shows likely sinus w/ 2:1 conduction w/ frequent PVC in ventricular bigeminy pattern, but ectopic atrial rhythm w/ PVCs in bigeminy pattern can be excluded.  Event monitor ordered, to be work for 14-days Echocardiogram and stress test ordered Continue to hold eyedrops until discussed with ophthalmologist   Mixed hyperlipidemia Continue lipitor   Essential hypertension Continue current cardiac medications. Encourage low-sodium diet, less than 2000 mg daily. Follow-up in 1-2 months or sooner if needed.     Clotilde Dieter, DO, [facility]  08/31/2022, 1:41 PM Office: [phone] Pager: [phone]

## 2022-09-08 ENCOUNTER — Ambulatory Visit: Payer: PPO

## 2022-09-08 DIAGNOSIS — I10 Essential (primary) hypertension: Secondary | ICD-10-CM

## 2022-09-08 DIAGNOSIS — E782 Mixed hyperlipidemia: Secondary | ICD-10-CM

## 2022-09-08 DIAGNOSIS — R9431 Abnormal electrocardiogram [ECG] [EKG]: Secondary | ICD-10-CM

## 2022-09-08 DIAGNOSIS — I498 Other specified cardiac arrhythmias: Secondary | ICD-10-CM | POA: Diagnosis not present

## 2022-09-09 DIAGNOSIS — H401133 Primary open-angle glaucoma, bilateral, severe stage: Secondary | ICD-10-CM | POA: Diagnosis not present

## 2022-09-27 ENCOUNTER — Ambulatory Visit: Payer: PPO

## 2022-09-27 DIAGNOSIS — I10 Essential (primary) hypertension: Secondary | ICD-10-CM

## 2022-09-27 DIAGNOSIS — E782 Mixed hyperlipidemia: Secondary | ICD-10-CM

## 2022-09-27 DIAGNOSIS — R9431 Abnormal electrocardiogram [ECG] [EKG]: Secondary | ICD-10-CM

## 2022-09-27 DIAGNOSIS — I498 Other specified cardiac arrhythmias: Secondary | ICD-10-CM | POA: Diagnosis not present

## 2022-09-27 LAB — PCV MYOCARDIAL PERFUSION WO LEXISCAN
Angina Index: 0
Duke Treadmill Score: -8
Exercise duration (min): 7 min
ST Depression (mm): 3 mm

## 2022-10-06 ENCOUNTER — Encounter: Payer: Self-pay | Admitting: Internal Medicine

## 2022-10-06 ENCOUNTER — Ambulatory Visit: Payer: PPO | Admitting: Internal Medicine

## 2022-10-06 VITALS — BP 131/81 | HR 69 | Ht 70.0 in | Wt 156.6 lb

## 2022-10-06 DIAGNOSIS — I10 Essential (primary) hypertension: Secondary | ICD-10-CM | POA: Diagnosis not present

## 2022-10-06 DIAGNOSIS — R9431 Abnormal electrocardiogram [ECG] [EKG]: Secondary | ICD-10-CM | POA: Diagnosis not present

## 2022-10-06 DIAGNOSIS — R002 Palpitations: Secondary | ICD-10-CM | POA: Diagnosis not present

## 2022-10-06 DIAGNOSIS — E782 Mixed hyperlipidemia: Secondary | ICD-10-CM

## 2022-10-06 DIAGNOSIS — I498 Other specified cardiac arrhythmias: Secondary | ICD-10-CM | POA: Diagnosis not present

## 2022-10-06 MED ORDER — DILTIAZEM HCL 30 MG PO TABS: 30.0000 mg | ORAL_TABLET | Freq: Three times a day (TID) | ORAL | 6 refills | Status: AC | PRN

## 2022-10-06 MED ORDER — ASPIRIN 81 MG PO TBEC: 81.0000 mg | DELAYED_RELEASE_TABLET | Freq: Every day | ORAL | 12 refills | Status: AC

## 2022-10-06 NOTE — Progress Notes (Signed)
Primary Physician/Referring:  Irena Reichmann, DO  Patient ID: Charlotte Gibson, female    DOB: [birth year], 66 y.o.   MRN: [id number]  Chief Complaint  Patient presents with   Abnormal ECG   Follow-up   Results   HPI:    Charlotte Gibson  is a 66 y.o. female with past medical history significant for hypertension, hyperlipidemia, and palpitations who is here for a follow-up visit. She has been doing well since the last time she was here. Patient is still having palpitations and she is agreeable to trying a PRN medication for this. She did not have any symptoms at all during her stress test even though she did have some EKG changes during the exam. Patient states she could have kept going and she had no chest pain or palpitations or shortness of breath during this test. Patient denies chest pain, shortness of breath, diaphoresis, syncope, edema, PND, orthopnea.   Past Medical History:  Diagnosis Date   GERD (gastroesophageal reflux disease)    Hyperlipidemia    Hypertension    Osteoporosis    Past Surgical History:  Procedure Laterality Date   ABDOMINAL HYSTERECTOMY     at 89   APPENDECTOMY     at 66yo   Family History  Problem Relation Age of Onset   Osteoporosis Mother    Stroke Father    Osteoporosis Father    Atrial fibrillation Father    Heart failure Sister     Social History   Tobacco Use   Smoking status: Never   Smokeless tobacco: Never  Substance Use Topics   Alcohol use: Yes    Alcohol/week: 1.0 standard drink of alcohol    Types: 1 Cans of beer per week    Comment: occ   Marital Status: Divorced  ROS  Review of Systems  Cardiovascular:  Positive for irregular heartbeat and palpitations. Negative for chest pain and dyspnea on exertion.   Objective  Blood pressure 131/81, pulse 69, height 5\' 10"  (1.778 m), weight 156 lb 9.6 oz (71 kg), SpO2 99 %. Body mass index is 22.47 kg/m.     10/06/2022   10:51 AM 08/31/2022    1:28 PM 08/30/2022    8:45 PM  Vitals  with BMI  Height 5\' 10"  5\' 10"    Weight 156 lbs 10 oz 158 lbs 13 oz   BMI 22.47 22.79   Systolic 131 131 13/03/2022  Diastolic 81 85 79  Pulse 69 99 75     Physical Exam Vitals reviewed.  HENT:     Head: Normocephalic and atraumatic.  Cardiovascular:     Rate and Rhythm: Normal rate and regular rhythm.     Pulses: Normal pulses.     Heart sounds: Murmur heard.  Pulmonary:     Effort: Pulmonary effort is normal.     Breath sounds: Normal breath sounds.  Abdominal:     General: Bowel sounds are normal.  Musculoskeletal:     Right lower leg: No edema.     Left lower leg: No edema.  Skin:    General: Skin is warm and dry.  Neurological:     Mental Status: She is alert.     Medications and allergies  No Known Allergies   Medication list after today's encounter   Current Outpatient Medications:    aspirin EC 81 MG tablet, Take 1 tablet (81 mg total) by mouth daily. Swallow whole., Disp: 30 tablet, Rfl: 12   atorvastatin (LIPITOR) 40 MG tablet, Take 40 mg  by mouth daily., Disp: , Rfl:    brimonidine (ALPHAGAN) 0.2 % ophthalmic solution, 1 drop 2 (two) times daily., Disp: , Rfl:    Calcium Carbonate (CALCIUM 500 PO), Take 1 tablet by mouth daily., Disp: , Rfl:    diltiazem (CARDIZEM) 30 MG tablet, Take 1 tablet (30 mg total) by mouth 3 (three) times daily as needed., Disp: 90 tablet, Rfl: 6   dorzolamide-timolol (COSOPT) 22.3-6.8 MG/ML ophthalmic solution, 1 drop 2 (two) times daily., Disp: , Rfl:    latanoprost (XALATAN) 0.005 % ophthalmic solution, Place 1 drop into both eyes 2 (two) times daily. , Disp: , Rfl:    losartan (COZAAR) 50 MG tablet, Take 50 mg by mouth in the morning and at bedtime., Disp: , Rfl:    Multiple Vitamin (MULTIVITAMIN WITH MINERALS) TABS, Take 1 tablet by mouth daily., Disp: , Rfl:    RHOPRESSA 0.02 % SOLN, INSTILL 1 DROP IN BOTH EYES EVERY EVENING, Disp: , Rfl:   Laboratory examination:   Lab Results  Component Value Date   NA 139 08/30/2022   K 4.0  08/30/2022   CO2 25 08/30/2022   GLUCOSE 129 (H) 08/30/2022   BUN 14 08/30/2022   CREATININE 0.78 08/30/2022   CALCIUM 9.5 08/30/2022   GFRNONAA >60 08/30/2022       Latest Ref Rng & Units 08/30/2022    3:42 PM 06/25/2020    1:55 PM 01/14/2020   12:00 AM  CMP  Glucose 70 - 99 mg/dL 338  250    BUN 8 - 23 mg/dL 14  19  17       Creatinine 0.44 - 1.00 mg/dL  5.39  0.8      Sodium 135 - 145 mmol/L 139  142  142      Potassium 3.5 - 5.1 mmol/L 4.0  4.3    Chloride 98 - 111 mmol/L 110  106    CO2 22 - 32 mmol/L 25  29    Calcium 8.9 - 10.3 mg/dL 9.5  7.67    34.1  9.2         This result is from an external source.   Multiple values from one day are sorted in reverse-chronological order      Latest Ref Rng & Units 08/30/2022    3:42 PM 04/16/2013    8:25 PM  CBC  WBC 4.0 - 10.5 K/uL 8.9  7.0   Hemoglobin 12.0 - 15.0 g/dL 04/18/2013  90.2   Hematocrit 36.0 - 46.0 % 45.0  39.4   Platelets 150 - 400 K/uL 322  296     Lipid Panel No results for input(s): "CHOL", "TRIG", "LDLCALC", "VLDL", "HDL", "CHOLHDL", "LDLDIRECT" in the last 8760 hours.  HEMOGLOBIN A1C No results found for: "HGBA1C", "MPG" TSH No results for input(s): "TSH" in the last 8760 hours.  External labs:     Radiology:    Cardiac Studies:   Echocardiogram 09/08/2022:  Normal LV systolic function with visual EF 55-60%. Left ventricle cavity  is normal in size. Normal left ventricular wall thickness. Normal global  wall motion. Normal diastolic filling pattern, normal LAP. Calculated EF  56%.  Right ventricle cavity is mildly dilated. Normal right ventricular  function.  Structurally normal tricuspid valve.  Mild to moderate tricuspid  regurgitation. No evidence of pulmonary hypertension.    Exercise Tetrofosmin stress test 09/27/2022: No previous exam available for comparison. Exercise nuclear stress test was performed using Bruce protocol. Patient reached 8.2 METS, and 86% of age  predicted maximum  heart rate. Exercise capacity was good. No chest pain reported. Heart rate and hemodynamic response were normal. Stress EKG sowed sinus tachycardia, 2-3 mm horizontal/down-sloping ST depression in leads II, III, aVF, V4-V6 that partially normalize 2 min into recovery.  Normal myocardial perfusion, however, with TID 1.29. With Duke treadmill score of -8.  Intermediate risk study.    Patch Wear Time:  13 days and 23 hours (2023-11-15T14:27:46-0500 to 2023-11-29T14:27:38-0500) Patient had a min HR of 48 bpm, max HR of 185 bpm, and avg HR of 71 bpm. Predominant underlying rhythm was Sinus Rhythm. 65 Supraventricular Tachycardia runs occurred, the run with the fastest interval lasting 7 beats with a max rate of 185 bpm, the  longest lasting 11.6 secs with an avg rate of 123 bpm. Isolated SVEs were rare (<1.0%), SVE Couplets were rare (<1.0%), and SVE Triplets were rare (<1.0%). Isolated VEs were frequent (7.8%, [id number]), VE Couplets were rare (<1.0%, 728), and no VE Triplets  were present. Ventricular Bigeminy and Trigeminy were present.    EKG:   08/31/2022: sinus w/ 2:1 conduction w/ frequent PVC in ventricular bigeminy pattern, ectopic atrial rhythm w/ PVCs in bigeminy pattern can be excluded.   Assessment     ICD-10-CM   1. Essential hypertension  I10     2. Mixed hyperlipidemia  E78.2     3. Palpitations  R00.2        No orders of the defined types were placed in this encounter.   Meds ordered this encounter  Medications   diltiazem (CARDIZEM) 30 MG tablet    Sig: Take 1 tablet (30 mg total) by mouth 3 (three) times daily as needed.    Dispense:  90 tablet    Refill:  6   aspirin EC 81 MG tablet    Sig: Take 1 tablet (81 mg total) by mouth daily. Swallow whole.    Dispense:  30 tablet    Refill:  12    There are no discontinued medications.    Recommendations:   Charlotte Gibson is a 66 y.o.  F with palpitations, HTN, and HLD    Palpitations EKG 11/6 and 11/7 shows  likely sinus w/ 2:1 conduction w/ frequent PVC in ventricular bigeminy pattern, but ectopic atrial rhythm w/ PVCs in bigeminy pattern can be excluded.  Event monitor completed - no high degree block or persistent arrhythmia Echocardiogram and stress test within normal limits Stress test EKG portion equivocal but pt had absolutely no symptoms during her stress test Will initiate diltiazem for palpitations, can be taken up to 3 times daily   Mixed hyperlipidemia Continue lipitor   Essential hypertension Continue current cardiac medications. Encourage low-sodium diet, less than 2000 mg daily. Follow-up in 6 months or sooner if needed.     Clotilde Dieter, DO, [facility]  10/13/2022, 1:01 PM Office: [phone] Pager: [phone]

## 2022-10-07 ENCOUNTER — Encounter: Payer: Self-pay | Admitting: Internal Medicine

## 2022-10-07 NOTE — Telephone Encounter (Signed)
From patient.

## 2022-10-11 ENCOUNTER — Ambulatory Visit: Payer: PPO | Admitting: Internal Medicine

## 2022-11-15 DIAGNOSIS — R7309 Other abnormal glucose: Secondary | ICD-10-CM | POA: Diagnosis not present

## 2022-11-15 DIAGNOSIS — E785 Hyperlipidemia, unspecified: Secondary | ICD-10-CM | POA: Diagnosis not present

## 2022-11-15 DIAGNOSIS — R946 Abnormal results of thyroid function studies: Secondary | ICD-10-CM | POA: Diagnosis not present

## 2022-11-15 DIAGNOSIS — Z8739 Personal history of other diseases of the musculoskeletal system and connective tissue: Secondary | ICD-10-CM | POA: Diagnosis not present

## 2022-11-15 DIAGNOSIS — E559 Vitamin D deficiency, unspecified: Secondary | ICD-10-CM | POA: Diagnosis not present

## 2022-11-15 DIAGNOSIS — I10 Essential (primary) hypertension: Secondary | ICD-10-CM | POA: Diagnosis not present

## 2022-11-16 ENCOUNTER — Institutional Professional Consult (permissible substitution): Payer: PPO | Admitting: Internal Medicine

## 2022-11-22 DIAGNOSIS — I10 Essential (primary) hypertension: Secondary | ICD-10-CM | POA: Diagnosis not present

## 2022-11-22 DIAGNOSIS — E785 Hyperlipidemia, unspecified: Secondary | ICD-10-CM | POA: Diagnosis not present

## 2022-11-22 DIAGNOSIS — F419 Anxiety disorder, unspecified: Secondary | ICD-10-CM | POA: Diagnosis not present

## 2022-11-22 DIAGNOSIS — I499 Cardiac arrhythmia, unspecified: Secondary | ICD-10-CM | POA: Diagnosis not present

## 2022-11-22 DIAGNOSIS — H409 Unspecified glaucoma: Secondary | ICD-10-CM | POA: Diagnosis not present

## 2022-11-22 DIAGNOSIS — R7309 Other abnormal glucose: Secondary | ICD-10-CM | POA: Diagnosis not present

## 2022-11-22 DIAGNOSIS — Z8739 Personal history of other diseases of the musculoskeletal system and connective tissue: Secondary | ICD-10-CM | POA: Diagnosis not present

## 2022-11-22 DIAGNOSIS — Z0000 Encounter for general adult medical examination without abnormal findings: Secondary | ICD-10-CM | POA: Diagnosis not present

## 2022-11-22 DIAGNOSIS — E559 Vitamin D deficiency, unspecified: Secondary | ICD-10-CM | POA: Diagnosis not present

## 2022-11-22 DIAGNOSIS — R079 Chest pain, unspecified: Secondary | ICD-10-CM | POA: Diagnosis not present

## 2023-01-19 DIAGNOSIS — H25813 Combined forms of age-related cataract, bilateral: Secondary | ICD-10-CM | POA: Diagnosis not present

## 2023-01-19 DIAGNOSIS — H35033 Hypertensive retinopathy, bilateral: Secondary | ICD-10-CM | POA: Diagnosis not present

## 2023-01-19 DIAGNOSIS — H401133 Primary open-angle glaucoma, bilateral, severe stage: Secondary | ICD-10-CM | POA: Diagnosis not present

## 2023-01-19 DIAGNOSIS — H524 Presbyopia: Secondary | ICD-10-CM | POA: Diagnosis not present

## 2023-01-19 DIAGNOSIS — H04123 Dry eye syndrome of bilateral lacrimal glands: Secondary | ICD-10-CM | POA: Diagnosis not present

## 2023-04-06 ENCOUNTER — Ambulatory Visit: Payer: PPO | Admitting: Internal Medicine

## 2023-04-11 ENCOUNTER — Encounter (INDEPENDENT_AMBULATORY_CARE_PROVIDER_SITE_OTHER): Payer: Self-pay | Admitting: Cardiovascular Disease

## 2023-04-11 ENCOUNTER — Ambulatory Visit (INDEPENDENT_AMBULATORY_CARE_PROVIDER_SITE_OTHER): Payer: Medicare Other | Admitting: Cardiovascular Disease

## 2023-04-11 VITALS — BP 116/78 | HR 61 | Ht 69.0 in | Wt 147.0 lb

## 2023-04-11 DIAGNOSIS — R072 Precordial pain: Secondary | ICD-10-CM

## 2023-04-11 DIAGNOSIS — I10 Essential (primary) hypertension: Secondary | ICD-10-CM

## 2023-04-11 DIAGNOSIS — E785 Hyperlipidemia, unspecified: Secondary | ICD-10-CM

## 2023-04-11 LAB — ECG 12-LEAD
Atrial Rate: 61 {beats}/min
IHS MUSE NARRATIVE AND IMPRESSION: NORMAL
P Axis: 60 degrees
P-R Interval: 128 ms
Q-T Interval: 416 ms
QRS Duration: 74 ms
QTC Calculation (Bezet): 418 ms
R Axis: 60 degrees
T Axis: 58 degrees
Ventricular Rate: 61 {beats}/min

## 2023-04-11 NOTE — Progress Notes (Signed)
[facility] CARDIOLOGY OFFICE CONSULTATION NOTE    HRT [facility] OFFICE  [place] [facility] OFFICE [facility]  [address]  [place] [address]  Dept: [phone]  Dept Fax: [phone]     Patient Name: Sherry Henderson, Sherry Henderson    Date of Visit:  April 11, 2023  Date of Birth: [birth year]  AGE: 67 y.o.  Medical Record #: [id number]  Requesting Physician: Orland Jarred, MD    CHIEF COMPLAINT:  Establish Care, Hypertension, and Chest Pain      HISTORY OF PRESENT ILLNESS    Sherry Henderson is being seen today for cardiovascular evaluation at the request of Orland Jarred, MD. She is a pleasant 67 y.o. female who has been referred for cardiovascular evaluation of chest pain.    She has a history of hypertension, hyperlipidemia, and palpitations.  She initially sought care for bradycardia and ultimately was determined to have bigeminy resulting in lower calculated heartbeats but was otherwise asymptomatic and reported feeling well.  Since that time, she started noticing "chest discomfort" that was random in nature and would happen infrequently.  She attributes the chest discomfort to becoming more aware of her lower heart rate (which was essentially asymptomatic otherwise).  She ultimately saw cardiology for further evaluation.    As part of his prior cardiovascular evaluation, she underwent a 2D echocardiogram in 08/2022 demonstrating normal biventricular size and function.  Furthermore, she underwent an exercise nuclear stress test with normal myocardial perfusion although there was 2-3 mm ST depressions in the inferior and lateral leads and transient ischemic dilatation was noted to be 1.29.  She also had a cardiac ambulatory monitor which was within normal limits.  Ultimately, she was started on a calcium channel blocker for PVCs with ventricular bigeminy.  She reported that the calcium channel blocker resulted in unclear side effects and she stopped the medication abruptly.  She  reports that the discomfort in her chest is nagging and comes and goes with no pattern.  There is no associated with exertion.  She is more frustrated that she never had a discussion with her cardiologist previously and wants to have an understanding of her prior workup and current symptomatology.      PAST MEDICAL HISTORY: She has a past medical history of Arrhythmia (Not sure.), Hyperlipidemia (2018), and Hypertension (1980). She has no past surgical history on file.    ALLERGIES: No Known Allergies    MEDICATIONS:   Current Outpatient Medications:     aspirin EC 81 MG EC tablet, Take 1 tablet (81 mg) by mouth daily, Disp: , Rfl:     atorvastatin (LIPITOR) 40 MG tablet, Take 1 tablet (40 mg) by mouth nightly For 90 days, Disp: , Rfl:     brimonidine (ALPHAGAN) 0.2 % ophthalmic solution, Place 1 drop into both eyes 2 (two) times daily, Disp: , Rfl:     latanoprost (XALATAN) 0.005 % ophthalmic solution, INSTILL 1 DROP IN BOTH EYES EVERY DAY, Disp: , Rfl:     losartan (COZAAR) 100 MG tablet, Take 1 tablet (100 mg) by mouth daily, Disp: , Rfl:     Rhopressa 0.02 % Solution, INSTILL 1 DROP INTO BOTH EYES EVERY NIGHT AT BEDTIME INSTILL 1 DROP IN BOTH EYES EVERY EVENING, Disp: , Rfl:      FAMILY HISTORY: family history includes Atrial fibrillation in her father; Diabetes in her father; Hypertension in her mother; Stroke in her father; Sudden death in her father.    SOCIAL HISTORY: She  reports that she has quit smoking. Her smoking use included cigarettes. She does not have any smokeless tobacco history on file. She reports current alcohol use. She reports that she does not currently use drugs after having used the following drugs: Marijuana.    PHYSICAL EXAMINATION  Visit Vitals  BP 116/78 (BP Site: Left arm, Patient Position: Sitting, Cuff Size: Medium)   Pulse 61   Ht 1.753 m (5\' 9" )   Wt 66.7 kg (147 lb)   BMI 21.71 kg/m      Constitutional: Cooperative, alert, no acute distress.  Neck: No carotid bruits, JVP  normal.  Cardiac: Regular rate and rhythm, normal S1 and S2; no S3 or S4, no murmurs, no rubs, no gallops.  Pulmonary: Clear to auscultation bilaterally, no wheezing, no rhonchi, no rales.  Extremities: no edema.  Vascular: +2 pulses in radial artery bilaterally, 2+ pedal pulses bilaterally.    ECG: Normal sinus rhythm, normal ECG    LABS:   No results found for: "WBC", "HGB", "HCT", "PLT"  No results found for: "GLU", "BUN", "CREAT", "NA", "K", "CL", "CO2", "AST", "ALT"  No results found for: "MG", "TSH", "HGBA1C", "BNP"  No results found for: "CHOL", "TRIG", "HDL", "LDL"    IMPRESSION:   Sherry Henderson is a 67 y.o. female with the following problems:    Atypical/noncardiac chest discomfort  PVCs with ventricular bigeminy historically resulting in calculated lower heart rates prompting cardiovascular evaluation (asymptomatic  Normal 2D echocardiogram from 08/2022 [facility]) reviewed records  Normal myocardial perfusion on exercise nuclear stress test from 08/2022 with 2-3 mm ST depressions and transient ischemic dilatation noted to be 1.29 but no symptoms and ultimately determined to be a normal stress test ([facility]) reviewed records  Unremarkable cardiac ambulatory monitor from 08/2022 aside from supraventricular and ventricular ectopy ([facility]) records reviewed  Hypertension, well-controlled  Hyperlipidemia, well-controlled      RECOMMENDATIONS:    We had a lengthy conversation regarding the nature of her chest discomfort which is fairly atypical nature.  Importantly, she has no exertional component and her symptoms are rather random in nature.  Her prior workup was personally reviewed and she had normal myocardial perfusion with no chest pain on an exercise nuclear stress test and her echocardiogram was within normal limits.  While there was reportedly transient ischemic dilatation (unfortunately cannot review the images themselves), with normal exercise tolerance and no symptoms with exertion it was  determined that she is unlikely to have obstructive coronary disease (which is very reasonable from my standpoint).  That being said, I did offer her a cardiac CTA to "put it to rest" although I do have a low suspicion for there being obstructive coronary disease.  It would be helpful for cardiac CTA to definitively describe her coronary anatomy so that there can be no further ambiguity and it will also help her to "move forward" without concerns about her chest discomfort.  Her preference at this time is to not have any further testing but also admits that perhaps she is more attuned to her symptoms and somewhat anxious and she would like to see if her symptoms will improve now that she is a little bit less anxious about her symptomatology.  In this regard, she will reach back out to me if she has any recurrent chest discomfort and we can arrange a cardiac CTA at that point in time.  She can follow-up in cardiology on a "as needed" basis moving forward.  Otherwise, she should follow-up with  primary care.  In total, 45 minutes were spent in the care and management of this patient, including relevant documentation and chart review.                                                 Orders Placed This Encounter   Procedures    ECG 12 lead (Normal)       No orders of the defined types were placed in this encounter.        SIGNED:    George Hugh, MD, [facility], [facility]  [facility]          This note was generated by the Dragon speech recognition and may contain errors or omissions not intended by the user. Grammatical errors, random word insertions, deletions, pronoun errors, and incomplete sentences are occasional consequences of this technology due to software limitations. Not all errors are caught or corrected. If there are questions or concerns about the content of this note or information contained within the body of this dictation, they should be addressed directly with the author for clarification.

## 2023-04-14 ENCOUNTER — Other Ambulatory Visit (INDEPENDENT_AMBULATORY_CARE_PROVIDER_SITE_OTHER): Payer: Self-pay | Admitting: Internal Medicine

## 2023-08-18 ENCOUNTER — Other Ambulatory Visit: Payer: Self-pay | Admitting: Internal Medicine

## 2023-12-27 ENCOUNTER — Other Ambulatory Visit: Payer: Self-pay | Admitting: Internal Medicine

## 2024-03-21 ENCOUNTER — Other Ambulatory Visit: Payer: Self-pay | Admitting: Internal Medicine

## 2024-05-04 ENCOUNTER — Encounter (INDEPENDENT_AMBULATORY_CARE_PROVIDER_SITE_OTHER): Payer: Self-pay

## 2024-05-21 ENCOUNTER — Other Ambulatory Visit (INDEPENDENT_AMBULATORY_CARE_PROVIDER_SITE_OTHER): Payer: Self-pay | Admitting: Internal Medicine

## 2024-06-07 ENCOUNTER — Other Ambulatory Visit (INDEPENDENT_AMBULATORY_CARE_PROVIDER_SITE_OTHER): Payer: Self-pay | Admitting: Internal Medicine

## 2024-07-04 ENCOUNTER — Other Ambulatory Visit (INDEPENDENT_AMBULATORY_CARE_PROVIDER_SITE_OTHER): Payer: Self-pay | Admitting: Internal Medicine

## 2024-09-18 ENCOUNTER — Ambulatory Visit: Attending: Gastroenterology | Admitting: Gastroenterology

## 2024-09-18 DIAGNOSIS — Z1211 Encounter for screening for malignant neoplasm of colon: Secondary | ICD-10-CM

## 2024-09-18 DIAGNOSIS — K640 First degree hemorrhoids: Secondary | ICD-10-CM

## 2024-09-18 DIAGNOSIS — K635 Polyp of colon: Secondary | ICD-10-CM

## 2024-09-18 DIAGNOSIS — Z860100 Personal history of colon polyps, unspecified: Secondary | ICD-10-CM

## 2024-09-25 LAB — SURGICAL PATHOLOGY

## 2024-12-06 ENCOUNTER — Ambulatory Visit (INDEPENDENT_AMBULATORY_CARE_PROVIDER_SITE_OTHER): Admitting: Cardiovascular Disease
